# Patient Record
Sex: Female | Born: 1935 | Race: White | Hispanic: No | Smoking: Former smoker
Health system: Southern US, Community
[De-identification: ages and names within clinical notes are randomized; demographics above are authoritative.]

## PROBLEM LIST (undated history)

## (undated) DIAGNOSIS — G459 Transient cerebral ischemic attack, unspecified: Secondary | ICD-10-CM

## (undated) DIAGNOSIS — F0390 Unspecified dementia without behavioral disturbance: Secondary | ICD-10-CM

## (undated) HISTORY — PX: OTHER SURGICAL HISTORY: SHX169

---

## 2012-10-23 DIAGNOSIS — F0390 Unspecified dementia without behavioral disturbance: Secondary | ICD-10-CM | POA: Insufficient documentation

## 2012-12-09 ENCOUNTER — Emergency Department: Payer: Self-pay | Admitting: Emergency Medicine

## 2012-12-09 LAB — COMPREHENSIVE METABOLIC PANEL
Albumin: 3.4 g/dL (ref 3.4–5.0)
Alkaline Phosphatase: 151 U/L — ABNORMAL HIGH (ref 50–136)
BUN: 19 mg/dL — ABNORMAL HIGH (ref 7–18)
Calcium, Total: 8.7 mg/dL (ref 8.5–10.1)
Chloride: 105 mmol/L (ref 98–107)
Co2: 23 mmol/L (ref 21–32)
Creatinine: 0.81 mg/dL (ref 0.60–1.30)
EGFR (Non-African Amer.): >60
SGOT(AST): 28 U/L (ref 15–37)
SGPT (ALT): 18 U/L (ref 12–78)
Sodium: 136 mmol/L (ref 136–145)
Total Protein: 7.7 g/dL (ref 6.4–8.2)

## 2012-12-09 LAB — CBC
HCT: 43.9 % (ref 35.0–47.0)
HGB: 14.9 g/dL (ref 12.0–16.0)
MCH: 30.2 pg (ref 26.0–34.0)
MCV: 89 fL (ref 80–100)
Platelet: 263 10*3/uL (ref 150–440)
WBC: 8.1 10*3/uL (ref 3.6–11.0)

## 2012-12-09 LAB — TROPONIN I
Troponin-I: <0.02 ng/mL
Troponin-I: <0.02 ng/mL

## 2012-12-09 LAB — CK TOTAL AND CKMB (NOT AT ARMC): CK, Total: 164 U/L (ref 21–215)

## 2012-12-09 LAB — PRO B NATRIURETIC PEPTIDE: B-Type Natriuretic Peptide: 507 pg/mL — ABNORMAL HIGH (ref 0–450)

## 2016-02-02 ENCOUNTER — Emergency Department: Payer: Medicare HMO

## 2016-02-02 ENCOUNTER — Encounter: Payer: Self-pay | Admitting: *Deleted

## 2016-02-02 ENCOUNTER — Inpatient Hospital Stay
Admission: EM | Admit: 2016-02-02 | Discharge: 2016-02-04 | DRG: 689 | Payer: Medicare HMO | Attending: Internal Medicine | Admitting: Internal Medicine

## 2016-02-02 DIAGNOSIS — B9620 Unspecified Escherichia coli [E. coli] as the cause of diseases classified elsewhere: Secondary | ICD-10-CM | POA: Diagnosis present

## 2016-02-02 DIAGNOSIS — G9341 Metabolic encephalopathy: Secondary | ICD-10-CM | POA: Diagnosis present

## 2016-02-02 DIAGNOSIS — M6282 Rhabdomyolysis: Secondary | ICD-10-CM | POA: Diagnosis present

## 2016-02-02 DIAGNOSIS — Z7982 Long term (current) use of aspirin: Secondary | ICD-10-CM | POA: Diagnosis not present

## 2016-02-02 DIAGNOSIS — Z8249 Family history of ischemic heart disease and other diseases of the circulatory system: Secondary | ICD-10-CM

## 2016-02-02 DIAGNOSIS — N390 Urinary tract infection, site not specified: Principal | ICD-10-CM | POA: Diagnosis present

## 2016-02-02 DIAGNOSIS — I248 Other forms of acute ischemic heart disease: Secondary | ICD-10-CM | POA: Diagnosis present

## 2016-02-02 DIAGNOSIS — I4891 Unspecified atrial fibrillation: Secondary | ICD-10-CM | POA: Diagnosis present

## 2016-02-02 DIAGNOSIS — N179 Acute kidney failure, unspecified: Secondary | ICD-10-CM | POA: Diagnosis present

## 2016-02-02 DIAGNOSIS — E871 Hypo-osmolality and hyponatremia: Secondary | ICD-10-CM | POA: Diagnosis present

## 2016-02-02 DIAGNOSIS — F0390 Unspecified dementia without behavioral disturbance: Secondary | ICD-10-CM | POA: Diagnosis present

## 2016-02-02 DIAGNOSIS — R778 Other specified abnormalities of plasma proteins: Secondary | ICD-10-CM

## 2016-02-02 DIAGNOSIS — R7989 Other specified abnormal findings of blood chemistry: Secondary | ICD-10-CM

## 2016-02-02 DIAGNOSIS — R296 Repeated falls: Secondary | ICD-10-CM | POA: Diagnosis present

## 2016-02-02 DIAGNOSIS — Z87440 Personal history of urinary (tract) infections: Secondary | ICD-10-CM

## 2016-02-02 DIAGNOSIS — A419 Sepsis, unspecified organism: Secondary | ICD-10-CM | POA: Diagnosis present

## 2016-02-02 DIAGNOSIS — Z87891 Personal history of nicotine dependence: Secondary | ICD-10-CM

## 2016-02-02 DIAGNOSIS — Z8673 Personal history of transient ischemic attack (TIA), and cerebral infarction without residual deficits: Secondary | ICD-10-CM | POA: Diagnosis not present

## 2016-02-02 HISTORY — DX: Unspecified dementia, unspecified severity, without behavioral disturbance, psychotic disturbance, mood disturbance, and anxiety: F03.90

## 2016-02-02 HISTORY — DX: Transient cerebral ischemic attack, unspecified: G45.9

## 2016-02-02 LAB — COMPREHENSIVE METABOLIC PANEL
ALBUMIN: 3.3 g/dL — AB (ref 3.5–5.0)
ALT: 18 U/L (ref 14–54)
ANION GAP: 10 (ref 5–15)
AST: 26 U/L (ref 15–41)
Alkaline Phosphatase: 94 U/L (ref 38–126)
BUN: 20 mg/dL (ref 6–20)
CHLORIDE: 100 mmol/L — AB (ref 101–111)
CO2: 21 mmol/L — AB (ref 22–32)
CREATININE: 1.14 mg/dL — AB (ref 0.44–1.00)
Calcium: 8.9 mg/dL (ref 8.9–10.3)
GFR calc non Af Amer: 40 mL/min — ABNORMAL LOW (ref >60)
GFR, EST AFRICAN AMERICAN: 46 mL/min — AB (ref >60)
Glucose, Bld: 140 mg/dL — ABNORMAL HIGH (ref 65–99)
Potassium: 3.7 mmol/L (ref 3.5–5.1)
Sodium: 131 mmol/L — ABNORMAL LOW (ref 135–145)
Total Bilirubin: 1.2 mg/dL (ref 0.3–1.2)
Total Protein: 6.6 g/dL (ref 6.5–8.1)

## 2016-02-02 LAB — URINALYSIS COMPLETE WITH MICROSCOPIC (ARMC ONLY)
Bilirubin Urine: NEGATIVE
Glucose, UA: NEGATIVE mg/dL
Ketones, ur: NEGATIVE mg/dL
Nitrite: NEGATIVE
PH: 6 (ref 5.0–8.0)
PROTEIN: 100 mg/dL — AB
Specific Gravity, Urine: 1.011 (ref 1.005–1.030)

## 2016-02-02 LAB — LIPASE, BLOOD: LIPASE: 22 U/L (ref 11–51)

## 2016-02-02 LAB — CBC
HCT: 36.4 % (ref 35.0–47.0)
HEMOGLOBIN: 12.5 g/dL (ref 12.0–16.0)
MCH: 29.8 pg (ref 26.0–34.0)
MCHC: 34.3 g/dL (ref 32.0–36.0)
MCV: 86.8 fL (ref 80.0–100.0)
Platelets: 215 10*3/uL (ref 150–440)
RBC: 4.2 MIL/uL (ref 3.80–5.20)
RDW: 14.8 % — ABNORMAL HIGH (ref 11.5–14.5)
WBC: 13.2 10*3/uL — ABNORMAL HIGH (ref 3.6–11.0)

## 2016-02-02 LAB — CK: Total CK: 615 U/L — ABNORMAL HIGH (ref 38–234)

## 2016-02-02 LAB — TROPONIN I
TROPONIN I: 0.14 ng/mL — AB (ref <0.031)
TROPONIN I: 0.1 ng/mL — AB (ref <0.031)

## 2016-02-02 MED ORDER — ONDANSETRON HCL 4 MG PO TABS
4.0000 mg | ORAL_TABLET | Freq: Four times a day (QID) | ORAL | Status: DC | PRN
  Administered 2016-02-03 – 2016-02-04 (×2): 4 mg via ORAL
  Filled 2016-02-02 (×2): qty 1

## 2016-02-02 MED ORDER — DEXTROSE 5 % IV SOLN
1.0000 g | INTRAVENOUS | Status: AC
  Administered 2016-02-02: 1 g via INTRAVENOUS
  Filled 2016-02-02: qty 10

## 2016-02-02 MED ORDER — ACETAMINOPHEN 325 MG PO TABS
650.0000 mg | ORAL_TABLET | Freq: Four times a day (QID) | ORAL | Status: DC | PRN
Start: 2016-02-02 — End: 2016-02-04

## 2016-02-02 MED ORDER — ONDANSETRON HCL 4 MG/2ML IJ SOLN: 4.0000 mg | Freq: Four times a day (QID) | INTRAMUSCULAR | Status: DC | PRN

## 2016-02-02 MED ORDER — DOCUSATE SODIUM 100 MG PO CAPS
100.0000 mg | ORAL_CAPSULE | Freq: Two times a day (BID) | ORAL | Status: DC
  Administered 2016-02-02 – 2016-02-04 (×3): 100 mg via ORAL
  Filled 2016-02-02 (×4): qty 1

## 2016-02-02 MED ORDER — ASPIRIN 81 MG PO CHEW
324.0000 mg | CHEWABLE_TABLET | Freq: Once | ORAL | Status: AC
  Administered 2016-02-02: 324 mg via ORAL
  Filled 2016-02-02: qty 4

## 2016-02-02 MED ORDER — POLYETHYLENE GLYCOL 3350 17 G PO PACK: 17.0000 g | PACK | Freq: Every day | ORAL | Status: DC | PRN

## 2016-02-02 MED ORDER — LEVOFLOXACIN IN D5W 250 MG/50ML IV SOLN
250.0000 mg | Freq: Every day | INTRAVENOUS | Status: DC
  Administered 2016-02-02 – 2016-02-03 (×2): 250 mg via INTRAVENOUS
  Filled 2016-02-02 (×3): qty 50

## 2016-02-02 MED ORDER — ONDANSETRON HCL 4 MG/2ML IJ SOLN
4.0000 mg | INTRAMUSCULAR | Status: AC
  Administered 2016-02-02: 4 mg via INTRAVENOUS
  Filled 2016-02-02: qty 2

## 2016-02-02 MED ORDER — SODIUM CHLORIDE 0.9% FLUSH
3.0000 mL | Freq: Two times a day (BID) | INTRAVENOUS | Status: DC
  Administered 2016-02-02 – 2016-02-04 (×4): 3 mL via INTRAVENOUS

## 2016-02-02 MED ORDER — ENOXAPARIN SODIUM 30 MG/0.3ML ~~LOC~~ SOLN
30.0000 mg | SUBCUTANEOUS | Status: DC
  Filled 2016-02-02 (×2): qty 0.3

## 2016-02-02 MED ORDER — ASPIRIN 81 MG PO CHEW
81.0000 mg | CHEWABLE_TABLET | Freq: Every day | ORAL | Status: DC
  Administered 2016-02-03 – 2016-02-04 (×2): 81 mg via ORAL
  Filled 2016-02-02 (×2): qty 1

## 2016-02-02 MED ORDER — ACETAMINOPHEN 650 MG RE SUPP
650.0000 mg | Freq: Four times a day (QID) | RECTAL | Status: DC | PRN
Start: 2016-02-02 — End: 2016-02-04

## 2016-02-02 NOTE — ED Notes (Signed)
Pt able to ambulate to restroom and provide urine sample. Sample sent. RN will hold in and out cath.

## 2016-02-02 NOTE — ED Notes (Signed)
Patient transported to X-ray 

## 2016-02-02 NOTE — ED Notes (Signed)
Patient transported to CT 

## 2016-02-02 NOTE — H&P (Signed)
[facility] - [place] at [facility]   PATIENT NAME: Rhonda Castaneda    MR#:  [id number]  DATE OF BIRTH:  September 30, 1918  DATE OF ADMISSION:  02/02/2016  PRIMARY CARE PHYSICIAN: No primary care provider on file.   REQUESTING/REFERRING PHYSICIAN: Dr. Loleta Rose  CHIEF COMPLAINT:   Chief Complaint  Patient presents with  . Fall  . Altered Mental Status    HISTORY OF PRESENT ILLNESS:  Rhonda Castaneda  is a 82 y.o. female with a known history of mild dementia, history of TIAs not taking any medications currently brought in secondary to worsening confusion. Patient is confused and most of the history is obtained from her son at bedside. At baseline she is more alert and oriented. Noted to be more confused since yesterday. Also had an episode of nausea, extreme weakness and fall today and so brought to the emergency room. Labs indicate elevated troponin and also urinary tract infection. 2 weeks ago at an urgent care she was diagnosed with UTI and was started on Keflex. However they were called 24 hours later saying that the bacteria is resistant to Keflex and the change the antibiotic to amoxicillin. The culture results are not available at this time. Patient denies any chest pain. No EKG changes noted.  PAST MEDICAL HISTORY:   Past Medical History  Diagnosis Date  . Dementia   . TIA (transient ischemic attack)     PAST SURGICAL HISTORY:   Past Surgical History  Procedure Laterality Date  . Surgery on rib      when young    SOCIAL HISTORY:   Social History  Substance Use Topics  . Smoking status: Former Games developer  . Smokeless tobacco: Not on file     Comment: quit about 50 years ago  . Alcohol Use: No    FAMILY HISTORY:   Family History  Problem Relation Age of Onset  . CAD      DRUG ALLERGIES:  Not on File  REVIEW OF SYSTEMS:   Review of Systems  Unable to perform ROS: mental acuity    MEDICATIONS AT HOME:   Prior to Admission medications    Not on File      VITAL SIGNS:  Blood pressure 115/54, pulse 80, temperature 97.6 F (36.4 C), temperature source Oral, resp. rate 20, height 5\' 4"  (1.626 m), weight 67.189 kg (148 lb 2 oz), SpO2 93 %.  PHYSICAL EXAMINATION:   Physical Exam  GENERAL:  82 y.o.-year-old patient lying in the bed with no acute distress. Confused and repeating the same questions. EYES: Pupils equal, round, reactive to light and accommodation. No scleral icterus. Extraocular muscles intact.  HEENT: Head atraumatic, normocephalic. Oropharynx and nasopharynx clear.  NECK:  Supple, no jugular venous distention. No thyroid enlargement, no tenderness.  LUNGS: Normal breath sounds bilaterally, no wheezing, rales,rhonchi or crepitation. No use of accessory muscles of respiration.  CARDIOVASCULAR: S1, S2 normal. No rubs, or gallops. 2/6 systolic murmur present. ABDOMEN: Soft, nontender, nondistended. Bowel sounds present. No organomegaly or mass.  EXTREMITIES: No pedal edema, cyanosis, or clubbing.  NEUROLOGIC: Cranial nerves II through XII are intact. Muscle strength 5/5 in all extremities. Sensation intact. Gait not checked.  PSYCHIATRIC: The patient is alert and oriented x 1-2.  SKIN: No obvious rash, lesion, or ulcer.   LABORATORY PANEL:   CBC  Recent Labs Lab 02/02/16 1725  WBC 13.2*  HGB 12.5  HCT 36.4  PLT 215   ------------------------------------------------------------------------------------------------------------------  Chemistries   Recent Labs Lab  02/02/16 1725  NA 131*  K 3.7  CL 100*  CO2 21*  GLUCOSE 140*  BUN 20  CREATININE 1.14*  CALCIUM 8.9  AST 26  ALT 18  ALKPHOS 94  BILITOT 1.2   ------------------------------------------------------------------------------------------------------------------  Cardiac Enzymes  Recent Labs Lab 02/02/16 1725  TROPONINI 0.14*    ------------------------------------------------------------------------------------------------------------------  RADIOLOGY:  Dg Chest 2 View  02/02/2016  CLINICAL DATA:  Productive cough.  Recent fall. EXAM: CHEST  2 VIEW COMPARISON:  12/09/2012 FINDINGS: Heart size remains within normal limits. Increased central peribronchial thickening is noted as well mild bibasilar atelectasis versus infiltrates. No evidence of pleural effusion. IMPRESSION: Increased central peribronchial thickening, and mild bibasilar atelectasis versus infiltrates. Electronically Signed   By: Myles Rosenthal M.D.   On: 02/02/2016 18:58   Ct Head Wo Contrast  02/02/2016  CLINICAL DATA:  Altered mental status since a fall last night. EXAM: CT HEAD WITHOUT CONTRAST CT CERVICAL SPINE WITHOUT CONTRAST TECHNIQUE: Multidetector CT imaging of the head and cervical spine was performed following the standard protocol without intravenous contrast. Multiplanar CT image reconstructions of the cervical spine were also generated. COMPARISON:  None. FINDINGS: CT HEAD FINDINGS Diffusely enlarged ventricles and subarachnoid spaces. Patchy white matter low density in both cerebral hemispheres. 1.2 x 0.8 cm oval area of increased density and the left parietal region in an extra-axial location on images 19 and 20. This measures 58 Hounsfield units in density. No skull fractures or paranasal sinus air-fluid levels. Mild sphenoid sinus mucosal thickening. CT CERVICAL SPINE FINDINGS Multilevel degenerative changes. These include facet degenerative changes with associated mild anterolisthesis at the C4-5, C5-6 and C6-7 levels. No prevertebral soft tissue swelling or fractures. IMPRESSION: 1. No skull fracture or intracranial hemorrhage. 2. No cervical spine fracture or traumatic subluxation. 3. Moderate to marked diffuse cerebral and cerebellar atrophy and chronic small vessel white matter ischemic changes. 4. 1.2 cm left parietal meningioma. 5. Cervical spine  degenerative changes. 6. Minimal chronic sphenoid sinusitis. Electronically Signed   By: Beckie Salts M.D.   On: 02/02/2016 18:39   Ct Cervical Spine Wo Contrast  02/02/2016  CLINICAL DATA:  Altered mental status since a fall last night. EXAM: CT HEAD WITHOUT CONTRAST CT CERVICAL SPINE WITHOUT CONTRAST TECHNIQUE: Multidetector CT imaging of the head and cervical spine was performed following the standard protocol without intravenous contrast. Multiplanar CT image reconstructions of the cervical spine were also generated. COMPARISON:  None. FINDINGS: CT HEAD FINDINGS Diffusely enlarged ventricles and subarachnoid spaces. Patchy white matter low density in both cerebral hemispheres. 1.2 x 0.8 cm oval area of increased density and the left parietal region in an extra-axial location on images 19 and 20. This measures 58 Hounsfield units in density. No skull fractures or paranasal sinus air-fluid levels. Mild sphenoid sinus mucosal thickening. CT CERVICAL SPINE FINDINGS Multilevel degenerative changes. These include facet degenerative changes with associated mild anterolisthesis at the C4-5, C5-6 and C6-7 levels. No prevertebral soft tissue swelling or fractures. IMPRESSION: 1. No skull fracture or intracranial hemorrhage. 2. No cervical spine fracture or traumatic subluxation. 3. Moderate to marked diffuse cerebral and cerebellar atrophy and chronic small vessel white matter ischemic changes. 4. 1.2 cm left parietal meningioma. 5. Cervical spine degenerative changes. 6. Minimal chronic sphenoid sinusitis. Electronically Signed   By: Beckie Salts M.D.   On: 02/02/2016 18:39    EKG:   Orders placed or performed during the hospital encounter of 02/02/16  . EKG 12-Lead  . EKG 12-Lead    IMPRESSION AND  PLAN:   Rhonda Castaneda  is a 82 y.o. female with a known history of mild dementia, history of TIAs not taking any medications currently brought in secondary to worsening confusion.  #1 altered mental  status-metabolic encephalopathy secondary to UTI. -CT of the head showing diffuse cerebral and cerebellar atrophy with chronic small vessel ischemic changes. No acute findings noted. Patient has dementia at baseline.  #2 UTI-urine cultures are sent. Since she was recently told that her urine cultures grew bacteria not sensitive to Keflex, start on Levaquin at this time. Follow repeat urine cultures  #3 acute renal failure-prerenal causes. IV fluids and monitor  #4 elevated troponin-could be demand ischemia. Patient denying any chest pain. Start aspirin. -Hold off on systemic anticoagulation. Monitor on telemetry. -Cardiology consult. Recycle troponins  #5 DVT prophylaxis-on Lovenox  Physical therapy consult and social worker consult.    All the records are reviewed and case discussed with ED provider. Management plans discussed with the patient, family and they are in agreement.  CODE STATUS: Full Code  TOTAL TIME TAKING CARE OF THIS PATIENT: 50 minutes.    Enid BaasKALISETTI,Marquise Wicke M.D on 02/02/2016 at 8:58 PM  Between 7am to 6pm - Pager - [phone]  After 6pm go to [email or web address] - password [email or web address] [facility]  [facility]  Office  [phone]  CC: Primary care physician; No primary care provider on file.

## 2016-02-02 NOTE — Progress Notes (Signed)
Anticoagulation monitoring(Lovenox):  82 yo  ordered Lovenox 40 mg Q24h  Filed Weights   02/02/16 1719  Weight: 148 lb 2 oz (67.189 kg)   BMI  Lab Results  Component Value Date   CREATININE 1.14* 02/02/2016   CREATININE 0.81 12/09/2012   Estimated Creatinine Clearance: 27.8 mL/min (by C-G formula based on Cr of 1.14). Hemoglobin & Hematocrit     Component Value Date/Time   HGB 12.5 02/02/2016 1725   HGB 14.9 12/09/2012 0900   HCT 36.4 02/02/2016 1725   HCT 43.9 12/09/2012 0900     Per Protocol for Patient with estCrcl< 30 ml/min and BMI < 40, will transition to Lovenox 30 mg Q12h.

## 2016-02-02 NOTE — ED Notes (Signed)
Pt arrived to ED via EMS from [facility]. Unknown hx, allergies, medications. [facility] reports pt was at independent living for the past 4 years and [facility] does not have records yet. EMS reports pt is at ED due to a fall last night. PT has been reported to be confused since that time. Pt has been reportedly asking "wierd" questions. Pt does not have a PCP and son is reportedly not a good resource. Pt is alert upon arrival but continues to ask "what is wrong with me that you are doing all of this" pt is reminded by nursing staff that she has been reportedly falling more than normal and pt verbalized "Molli KnockOkay, is this a hospital?"

## 2016-02-02 NOTE — ED Notes (Signed)
Lab verbalized they could add CK lab onto green top already in lab.

## 2016-02-02 NOTE — Progress Notes (Signed)
Pt admitted to room 259. Oriented to self only, VSS, no complaints at this time. Pt educated on use of call bell and need to call nursing before up out of bed. Safety contract signed, bed alarm in use. Skin assessed and telemetry verified with Lexi, RN. RN will continue to monitor and treat per MD orders. Syliva Overmanassie A Allayna Erlich, RN

## 2016-02-02 NOTE — ED Provider Notes (Signed)
[facility] Emergency Department Provider Note  ____________________________________________  Time seen: Approximately 5:47 PM  I have reviewed the triage vital signs and the nursing notes.   HISTORY  Chief Complaint Fall and Altered Mental Status  The patient has severe and advanced chronic dementia which limits the history significantly  HPI Rhonda Castaneda is a 82 y.o. female who arrived initially with no known past medical history from a nursing facility where she has not lived for long for evaluation of multiple recent falls.  No other symptoms are provided and the patient is confused as to where she is and why she is here.  She has no complaints at this time except for being thirsty.  She states that she has no chest pain, shortness of breath, abdominal pain, vomiting, dysuria.  She did develop some nausea shortly after arrival which she states is "bad".  No other additional information about her history is available except that she has been falling occasionally recently.   History reviewed. No pertinent past medical history.  There are no active problems to display for this patient.   History reviewed. No pertinent past surgical history.  No current outpatient prescriptions on file.  Allergies Review of patient's allergies indicates not on file.  History reviewed. No pertinent family history.  Social History Social History  Substance Use Topics  . Smoking status: Former Games developermoker  . Smokeless tobacco: None  . Alcohol Use: No    Review of Systems Unable to obtain reliable review of systems from the patient given her dementia, but the only thing she complains of at this time is nausea ____________________________________________   PHYSICAL EXAM:  VITAL SIGNS: ED Triage Vitals  Enc Vitals Group     BP 02/02/16 1719 141/61 mmHg     Pulse Rate 02/02/16 1719 75     Resp 02/02/16 1719 21     Temp 02/02/16 1719 97.6 F (36.4 C)     Temp  Source 02/02/16 1719 Oral     SpO2 02/02/16 1719 98 %     Weight 02/02/16 1719 148 lb 2 oz (67.189 kg)     Height 02/02/16 1719 5\' 4"  (1.626 m)     Head Cir --      Peak Flow --      Pain Score --      Pain Loc --      Pain Edu? --      Excl. in GC? --     Constitutional: Alert, Oriented only to self.  No acute distress. Eyes: Conjunctivae are normal. PERRL. EOMI. Head: Atraumatic. Nose: No congestion/rhinnorhea. Mouth/Throat: Mucous membranes are moist.  Oropharynx non-erythematous. Neck: No stridor.  No meningeal signs.  No cervical spine tenderness to palpation. Cardiovascular: Normal rate, regular rhythm. Good peripheral circulation. Grossly normal heart sounds.   Respiratory: Normal respiratory effort.  No retractions. Lungs CTAB. Gastrointestinal: Soft and nontender. No distention.  Musculoskeletal: No lower extremity tenderness nor edema. No gross deformities of extremities. Neurologic:  Normal speech and language. No gross focal neurologic deficits are appreciated.  Skin:  Skin is warm, dry and intact. No rash noted.   ____________________________________________   LABS (all labs ordered are listed, but only abnormal results are displayed)  Labs Reviewed  COMPREHENSIVE METABOLIC PANEL - Abnormal; Notable for the following:    Sodium 131 (*)    Chloride 100 (*)    CO2 21 (*)    Glucose, Bld 140 (*)    Creatinine, Ser 1.14 (*)  Albumin 3.3 (*)    GFR calc non Af Amer 40 (*)    GFR calc Af Amer 46 (*)    All other components within normal limits  CBC - Abnormal; Notable for the following:    WBC 13.2 (*)    RDW 14.8 (*)    All other components within normal limits  URINALYSIS COMPLETEWITH MICROSCOPIC ([facility] ONLY) - Abnormal; Notable for the following:    Color, Urine YELLOW (*)    APPearance TURBID (*)    Hgb urine dipstick 2+ (*)    Protein, ur 100 (*)    Leukocytes, UA 3+ (*)    Bacteria, UA FEW (*)    Squamous Epithelial / LPF 0-5 (*)    All other  components within normal limits  TROPONIN I - Abnormal; Notable for the following:    Troponin I 0.14 (*)    All other components within normal limits  URINE CULTURE  LIPASE, BLOOD   ____________________________________________  EKG  ED ECG REPORT I, Burlene Montecalvo, the attending physician, personally viewed and interpreted this ECG.  Date: 02/02/2016 EKG Time: 17:19 Rate: 95 Rhythm: normal sinus rhythm QRS Axis: normal Intervals: normal other than sinus arrhythmia ST/T Wave abnormalities: normal Conduction Disturbances: none Narrative Interpretation: unremarkable  ____________________________________________  RADIOLOGY   Dg Chest 2 View  02/02/2016  CLINICAL DATA:  Productive cough.  Recent fall. EXAM: CHEST  2 VIEW COMPARISON:  12/09/2012 FINDINGS: Heart size remains within normal limits. Increased central peribronchial thickening is noted as well mild bibasilar atelectasis versus infiltrates. No evidence of pleural effusion. IMPRESSION: Increased central peribronchial thickening, and mild bibasilar atelectasis versus infiltrates. Electronically Signed   By: Myles Rosenthal M.D.   On: 02/02/2016 18:58   Ct Head Wo Contrast  02/02/2016  CLINICAL DATA:  Altered mental status since a fall last night. EXAM: CT HEAD WITHOUT CONTRAST CT CERVICAL SPINE WITHOUT CONTRAST TECHNIQUE: Multidetector CT imaging of the head and cervical spine was performed following the standard protocol without intravenous contrast. Multiplanar CT image reconstructions of the cervical spine were also generated. COMPARISON:  None. FINDINGS: CT HEAD FINDINGS Diffusely enlarged ventricles and subarachnoid spaces. Patchy white matter low density in both cerebral hemispheres. 1.2 x 0.8 cm oval area of increased density and the left parietal region in an extra-axial location on images 19 and 20. This measures 58 Hounsfield units in density. No skull fractures or paranasal sinus air-fluid levels. Mild sphenoid sinus mucosal  thickening. CT CERVICAL SPINE FINDINGS Multilevel degenerative changes. These include facet degenerative changes with associated mild anterolisthesis at the C4-5, C5-6 and C6-7 levels. No prevertebral soft tissue swelling or fractures. IMPRESSION: 1. No skull fracture or intracranial hemorrhage. 2. No cervical spine fracture or traumatic subluxation. 3. Moderate to marked diffuse cerebral and cerebellar atrophy and chronic small vessel white matter ischemic changes. 4. 1.2 cm left parietal meningioma. 5. Cervical spine degenerative changes. 6. Minimal chronic sphenoid sinusitis. Electronically Signed   By: Beckie Salts M.D.   On: 02/02/2016 18:39   Ct Cervical Spine Wo Contrast  02/02/2016  CLINICAL DATA:  Altered mental status since a fall last night. EXAM: CT HEAD WITHOUT CONTRAST CT CERVICAL SPINE WITHOUT CONTRAST TECHNIQUE: Multidetector CT imaging of the head and cervical spine was performed following the standard protocol without intravenous contrast. Multiplanar CT image reconstructions of the cervical spine were also generated. COMPARISON:  None. FINDINGS: CT HEAD FINDINGS Diffusely enlarged ventricles and subarachnoid spaces. Patchy white matter low density in both cerebral hemispheres. 1.2 x 0.8  cm oval area of increased density and the left parietal region in an extra-axial location on images 19 and 20. This measures 58 Hounsfield units in density. No skull fractures or paranasal sinus air-fluid levels. Mild sphenoid sinus mucosal thickening. CT CERVICAL SPINE FINDINGS Multilevel degenerative changes. These include facet degenerative changes with associated mild anterolisthesis at the C4-5, C5-6 and C6-7 levels. No prevertebral soft tissue swelling or fractures. IMPRESSION: 1. No skull fracture or intracranial hemorrhage. 2. No cervical spine fracture or traumatic subluxation. 3. Moderate to marked diffuse cerebral and cerebellar atrophy and chronic small vessel white matter ischemic changes. 4. 1.2 cm  left parietal meningioma. 5. Cervical spine degenerative changes. 6. Minimal chronic sphenoid sinusitis. Electronically Signed   By: Beckie Salts M.D.   On: 02/02/2016 18:39    ____________________________________________   PROCEDURES  Procedure(s) performed: None  Critical Care performed: No ____________________________________________   INITIAL IMPRESSION / ASSESSMENT AND PLAN / ED COURSE  Pertinent labs & imaging results that were available during my care of the patient were reviewed by me and considered in my medical decision making (see chart for details).  Initially we knew nothing about the patient's medical history, but I reached her son who is also her power of attorney by phone.  He confirmed that she has advanced dementia but no other significant medical issues.  He and his wife arrived and I spoke with all them in person.  The patient's workup is reassuring except that she has a significantly elevated troponin.  Given her age and comorbidity of dementia, I explained that it is difficult to say for sure what the cardiologist will want to do.  The family is open to all options including potentially invasive procedures if recommended.  I will give the patient a full dose aspirin but will hold on heparin at this time.  I discussed with the hospitalist who agrees with this plan.  8:02 PM The patient has a strongly positive urinary tract infection.  I will treat with ceftriaxone 1 g IV.  A urine culture has been sent.  ____________________________________________  FINAL CLINICAL IMPRESSION(S) / ED DIAGNOSES  Final diagnoses:  Elevated troponin I level  Chronic dementia, without behavioral disturbance     MEDICATIONS GIVEN DURING THIS VISIT:  Medications  aspirin chewable tablet 324 mg (not administered)  ondansetron (ZOFRAN) injection 4 mg (4 mg Intravenous Given 02/02/16 1834)  ceftriaxone 1 g IV   NEW OUTPATIENT MEDICATIONS STARTED DURING THIS VISIT:  New  Prescriptions   No medications on file      Note:  This document was prepared using Dragon voice recognition software and may include unintentional dictation errors.   Loleta Rose, MD 02/02/16 2002

## 2016-02-02 NOTE — Progress Notes (Signed)
ANTIBIOTIC CONSULT NOTE - INITIAL  Pharmacy Consult for Levaquin  Indication: UTI  Not on File  Patient Measurements: Height: 5\' 4"  (162.6 cm) Weight: 148 lb 2 oz (67.189 kg) IBW/kg (Calculated) : 54.7 Adjusted Body Weight:   Vital Signs: Temp: 97.6 F (36.4 C) (05/09 1719) Temp Source: Oral (05/09 1719) BP: 115/54 mmHg (05/09 2000) Pulse Rate: 80 (05/09 2000) Intake/Output from previous day:   Intake/Output from this shift:    Labs:  Recent Labs  02/02/16 1725  WBC 13.2*  HGB 12.5  PLT 215  CREATININE 1.14*   Estimated Creatinine Clearance: 27.8 mL/min (by C-G formula based on Cr of 1.14). No results for input(s): VANCOTROUGH, VANCOPEAK, VANCORANDOM, GENTTROUGH, GENTPEAK, GENTRANDOM, TOBRATROUGH, TOBRAPEAK, TOBRARND, AMIKACINPEAK, AMIKACINTROU, AMIKACIN in the last 72 hours.   Microbiology: No results found for this or any previous visit (from the past 720 hour(s)).  Medical History: Past Medical History  Diagnosis Date  . Dementia   . TIA (transient ischemic attack)     Medications:   (Not in a hospital admission) Assessment: CrCl = 27.8 ml/min  Goal of Therapy:  resolution of infection  Plan:  Expected duration 7 days with resolution of temperature and/or normalization of WBC   Will start levaquin 250 mg IV Q24H on 5/9.   Swara Donze D 02/02/2016,9:04 PM

## 2016-02-03 ENCOUNTER — Inpatient Hospital Stay (HOSPITAL_COMMUNITY)
Admit: 2016-02-03 | Discharge: 2016-02-03 | Disposition: A | Discharge status: Home / Self Care | Payer: Medicare HMO | Attending: Physician Assistant | Admitting: Physician Assistant

## 2016-02-03 DIAGNOSIS — A419 Sepsis, unspecified organism: Secondary | ICD-10-CM

## 2016-02-03 DIAGNOSIS — R7989 Other specified abnormal findings of blood chemistry: Secondary | ICD-10-CM

## 2016-02-03 LAB — BASIC METABOLIC PANEL
Anion gap: 8 (ref 5–15)
BUN: 23 mg/dL — ABNORMAL HIGH (ref 6–20)
CALCIUM: 8.6 mg/dL — AB (ref 8.9–10.3)
CO2: 23 mmol/L (ref 22–32)
CREATININE: 1.25 mg/dL — AB (ref 0.44–1.00)
Chloride: 101 mmol/L (ref 101–111)
GFR, EST AFRICAN AMERICAN: 41 mL/min — AB (ref >60)
GFR, EST NON AFRICAN AMERICAN: 35 mL/min — AB (ref >60)
Glucose, Bld: 129 mg/dL — ABNORMAL HIGH (ref 65–99)
Potassium: 4.1 mmol/L (ref 3.5–5.1)
Sodium: 132 mmol/L — ABNORMAL LOW (ref 135–145)

## 2016-02-03 LAB — MRSA PCR SCREENING: MRSA by PCR: NEGATIVE

## 2016-02-03 LAB — CBC
HCT: 34 % — ABNORMAL LOW (ref 35.0–47.0)
Hemoglobin: 11.8 g/dL — ABNORMAL LOW (ref 12.0–16.0)
MCH: 29.9 pg (ref 26.0–34.0)
MCHC: 34.6 g/dL (ref 32.0–36.0)
MCV: 86.5 fL (ref 80.0–100.0)
PLATELETS: 202 10*3/uL (ref 150–440)
RBC: 3.93 MIL/uL (ref 3.80–5.20)
RDW: 14.4 % (ref 11.5–14.5)
WBC: 9.6 10*3/uL (ref 3.6–11.0)

## 2016-02-03 LAB — TROPONIN I
TROPONIN I: 0.09 ng/mL — AB (ref <0.031)
TROPONIN I: 0.06 ng/mL — AB (ref <0.031)

## 2016-02-03 LAB — ECHOCARDIOGRAM COMPLETE
Height: 64 in
WEIGHTICAEL: 2370 [oz_av]

## 2016-02-03 NOTE — Evaluation (Signed)
Physical Therapy Evaluation Patient Details Name: Rhonda RohrerLillian S Castaneda MRN: [id number] DOB: 07-31-1919 Today's Date: 02/03/2016   History of Present Illness  Pt is a 82 y.o. F admitted to hospital for a fall and increased confusion. Pt found to have UTI after admission. Pt has hx of dementia and TIAs. Pt has lived at [facility] for past 4 years per chart.   Clinical Impression  Pt is a 82 y.o. F admitted to hospital for UTI and confusion. Prior to admission, pt lived [facility]. Pt confused during evaluation, therefore was a poor historian. Pt oriented to self, but not to location. Pt able to transfer x2 from recliner using RW and mod assist. Pt able to ambulate approx 5 ft using RW and mod assist. Pt anxious t/o all mobility, asking to sit-down soon after standing. Pt performed seated there-ex on B LE with mod to no assist. Pt demonstrates deficits in strength, balance, and mobility. Pt would benefit from further skilled PT to address deficits. Pt also will need 24/7 assistance for safety with ADLs. Recommend pt transition to higher level 24/7 care with home health PT to address deficits.     Follow Up Recommendations Home health PT;Supervision/Assistance - 24 hour    Equipment Recommendations       Recommendations for Other Services       Precautions / Restrictions Precautions Precautions: Fall Restrictions Weight Bearing Restrictions: No      Mobility  Bed Mobility               General bed mobility comments: Pt up in chair at start of evaluation.  Transfers Overall transfer level: Needs assistance Equipment used: Rolling walker (2 wheeled) Transfers: Sit to/from Stand Sit to Stand: Mod assist         General transfer comment: Pt able to transfer from EOB using RW and mod assist. Pt provided heavy cues regarding hand placement during transfer. Pt able to upright posture upon standing. Pt performed transfer x2 from recliner.    Ambulation/Gait Ambulation/Gait assistance: Mod assist Ambulation Distance (Feet): 5 Feet Assistive device: Rolling walker (2 wheeled) Gait Pattern/deviations: Step-to pattern;Staggering left;Staggering right Gait velocity: slow Gait velocity interpretation: <1.8 ft/sec, indicative of risk for recurrent falls General Gait Details: Pt able to ambulate approx 5 ft with RW and mod assist. Pt provided continuous cues regarding walker and foot placement. Pt anxious and stated she felt like she was going to fall, requested to sit down. After sitting for approx 30 secs, pt able to stand again and take steps in place before requesting to sit again.    Stairs            Wheelchair Mobility    Modified Rankin (Stroke Patients Only)       Balance Overall balance assessment: Needs assistance Sitting-balance support: Feet supported Sitting balance-Leahy Scale: Good Sitting balance - Comments: Pt demonstrated good sitting balance while sitting on edge of recliner for approx 2 mins.    Standing balance support: Bilateral upper extremity supported Standing balance-Leahy Scale: Poor Standing balance comment: Pt demonstrated poor standing balance with RW and assist from PT. Pt stated she felt unsteady when standing.                              Pertinent Vitals/Pain Pain Assessment: No/denies pain    Home Living Family/patient expects to be discharged to:: Assisted living  Home Equipment: Gilmer Mor - single point Additional Comments: Pt is poor historian, unable to obtain what equipment pt has at home.     Prior Function Level of Independence: Needs assistance   Gait / Transfers Assistance Needed: Pt stated she thinks she walked with cane prior to admission. No family present to confirm.  ADL's / Homemaking Assistance Needed: Pt stated she is independent with ADLs.         Hand Dominance        Extremity/Trunk Assessment   Upper Extremity  Assessment: RUE deficits/detail;LUE deficits/detail RUE Deficits / Details: R UE grossly 3+/5 strength     LUE Deficits / Details: L UE grossly 3+/5 strength   Lower Extremity Assessment: RLE deficits/detail;LLE deficits/detail RLE Deficits / Details: R LE grossly 4/5 strength LLE Deficits / Details: L ankle 4/5 strength, unable to assess knee strength d/t pts pain in knee.      Communication   Communication: No difficulties  Cognition Arousal/Alertness: Awake/alert Behavior During Therapy: Anxious Overall Cognitive Status: No family/caregiver present to determine baseline cognitive functioning (Pt has hx of dementia)                      General Comments      Exercises Other Exercises Other Exercises: Pt performed seated ther-ex on B LE including SLR with mod assist, ankle pumps and marching in place with no assist. Pt provided cues regarding proper form of exercises. All ther-ex performed x10 reps.        Assessment/Plan    PT Assessment Patient needs continued PT services  PT Diagnosis Abnormality of gait;Difficulty walking;Generalized weakness   PT Problem List Decreased strength;Decreased balance;Decreased mobility;Decreased knowledge of use of DME  PT Treatment Interventions DME instruction;Gait training;Therapeutic activities;Therapeutic exercise;Balance training   PT Goals (Current goals can be found in the Care Plan section) Acute Rehab PT Goals Patient Stated Goal: to return home.  PT Goal Formulation: With patient Time For Goal Achievement: 02/17/16 Potential to Achieve Goals: Fair    Frequency Min 2X/week   Barriers to discharge Decreased caregiver support Pt will need 24/7 care after discharge from hospital.     Co-evaluation               End of Session Equipment Utilized During Treatment: Gait belt Activity Tolerance: Treatment limited secondary to agitation Patient left: in chair;with call bell/phone within reach;with chair alarm set            Time: 1610-9604 PT Time Calculation (min) (ACUTE ONLY): 23 min   Charges:         PT G Codes:        Dorita Fray 02/21/2016, 12:19 PM M. Hettie Holstein, SPT

## 2016-02-03 NOTE — Consult Note (Signed)
Cardiology Consultation Note  Patient ID: Rhonda Castaneda, MRN: [id number], DOB/AGE: 10/01/1918 82 y.o. Admit date: 02/02/2016   Date of Consult: 02/03/2016 Primary Physician: No primary care provider on file. Primary Cardiologist: New to [facility] Requesting Physician: Henreitta Leber, MD  Chief Complaint: Fall, AMS Reason for Consult: Elevated troponin in the setting rhabdomyolysis   HPI: 82 y.o. female with h/o dementia, multiple recent falls, TIA, and not on any medications at home who presented to [facility] on 5[facility]9 with increased confusion and after suffering a fall and being down on the ground for an unknown duration. She was found to have a recurrent UTI after recently being treated for one and have an elevated CK of 615 as below as well as mildly elevated troponin that is down trending. Cardiology is consulted for the above mildly elevated troponin.  History is taken from prior notes given patient's dementia and no family present at this time. She has no previously known cardiac history. She does not known why she is here at the hospital. She does not recall falling. We are unable to assess if she had any chest pain, SOB, diaphoresis, palpitations, or nausea leading up to this event. It is unclear at this time if this was a mechanical fall at this time based on prior notes. She denies any symptoms currently c/w chest pain, though does have dementia and metabolic encephalopathy. She does not know if she has ever smoked tobacco. She initially asks if she can "go out of town" upon me walking in the room. She then asks if she can get up and walk to get her breakfast this morning.    Upon the patient's arrival to [facility] they were found to have CK of 615, troponin 0.14-->0.10-->0.09, CKMB 2.8, UA c/w UTI, SCr 1.14-->1.25, BUN-->23, K+ 3.7-->4.1, Na 131-->132, WBC 13.2-->9.6. ECG as below, CXR showed increased central peribronchial thickening and mild bibasilar atelectasis vs infiltrate. CT head/C spine no skull fx  or intracranial hemorrhage, no C spine fx. She was started on Levaquin and received 1 dose of Rocephin. There is no family present in the room at this time for further history.    Past Medical History  Diagnosis Date  . Dementia   . TIA (transient ischemic attack)       Most Recent Cardiac Studies: none   Surgical History:  Past Surgical History  Procedure Laterality Date  . Surgery on rib      when young     Home Meds: Prior to Admission medications   Not on File    Inpatient Medications:  . aspirin  81 mg Oral Daily  . docusate sodium  100 mg Oral BID  . enoxaparin (LOVENOX) injection  30 mg Subcutaneous Q24H  . levofloxacin (LEVAQUIN) IV  250 mg Intravenous QHS  . sodium chloride flush  3 mL Intravenous Q12H      Allergies: Not on File  Social History   Social History  . Marital Status: Unknown    Spouse Name: N/A  . Number of Children: N/A  . Years of Education: N/A   Occupational History  . Not on file.   Social History Main Topics  . Smoking status: Former Games developer  . Smokeless tobacco: Not on file     Comment: quit about 50 years ago  . Alcohol Use: No  . Drug Use: No  . Sexual Activity: No   Other Topics Concern  . Not on file   Social History Narrative   Ambulates with walker.  From assisted living.     Family History  Problem Relation Age of Onset  . CAD       Review of Systems: Review of Systems  Unable to perform ROS: dementia  She is not able to answer questions. Simply asks about when she can get out of bed  Labs:  Recent Labs  02/02/16 1725 02/02/16 2221 02/03/16 0428  CKTOTAL 615*  --   --   TROPONINI 0.14* 0.10* 0.09*   Lab Results  Component Value Date   WBC 9.6 02/03/2016   HGB 11.8* 02/03/2016   HCT 34.0* 02/03/2016   MCV 86.5 02/03/2016   PLT 202 02/03/2016    Recent Labs Lab 02/02/16 1725 02/03/16 0428  NA 131* 132*  K 3.7 4.1  CL 100* 101  CO2 21* 23  BUN 20 23*  CREATININE 1.14* 1.25*  CALCIUM 8.9  8.6*  PROT 6.6  --   BILITOT 1.2  --   ALKPHOS 94  --   ALT 18  --   AST 26  --   GLUCOSE 140* 129*   No results found for: CHOL, HDL, LDLCALC, TRIG No results found for: DDIMER  Radiology/Studies:  Dg Chest 2 View  02/02/2016  CLINICAL DATA:  Productive cough.  Recent fall. EXAM: CHEST  2 VIEW COMPARISON:  12/09/2012 FINDINGS: Heart size remains within normal limits. Increased central peribronchial thickening is noted as well mild bibasilar atelectasis versus infiltrates. No evidence of pleural effusion. IMPRESSION: Increased central peribronchial thickening, and mild bibasilar atelectasis versus infiltrates. Electronically Signed   By: Myles Rosenthal M.D.   On: 02/02/2016 18:58   Ct Head Wo Contrast  02/02/2016  CLINICAL DATA:  Altered mental status since a fall last night. EXAM: CT HEAD WITHOUT CONTRAST CT CERVICAL SPINE WITHOUT CONTRAST TECHNIQUE: Multidetector CT imaging of the head and cervical spine was performed following the standard protocol without intravenous contrast. Multiplanar CT image reconstructions of the cervical spine were also generated. COMPARISON:  None. FINDINGS: CT HEAD FINDINGS Diffusely enlarged ventricles and subarachnoid spaces. Patchy white matter low density in both cerebral hemispheres. 1.2 x 0.8 cm oval area of increased density and the left parietal region in an extra-axial location on images 19 and 20. This measures 58 Hounsfield units in density. No skull fractures or paranasal sinus air-fluid levels. Mild sphenoid sinus mucosal thickening. CT CERVICAL SPINE FINDINGS Multilevel degenerative changes. These include facet degenerative changes with associated mild anterolisthesis at the C4-5, C5-6 and C6-7 levels. No prevertebral soft tissue swelling or fractures. IMPRESSION: 1. No skull fracture or intracranial hemorrhage. 2. No cervical spine fracture or traumatic subluxation. 3. Moderate to marked diffuse cerebral and cerebellar atrophy and chronic small vessel white  matter ischemic changes. 4. 1.2 cm left parietal meningioma. 5. Cervical spine degenerative changes. 6. Minimal chronic sphenoid sinusitis. Electronically Signed   By: Beckie Salts M.D.   On: 02/02/2016 18:39   Ct Cervical Spine Wo Contrast  02/02/2016  CLINICAL DATA:  Altered mental status since a fall last night. EXAM: CT HEAD WITHOUT CONTRAST CT CERVICAL SPINE WITHOUT CONTRAST TECHNIQUE: Multidetector CT imaging of the head and cervical spine was performed following the standard protocol without intravenous contrast. Multiplanar CT image reconstructions of the cervical spine were also generated. COMPARISON:  None. FINDINGS: CT HEAD FINDINGS Diffusely enlarged ventricles and subarachnoid spaces. Patchy white matter low density in both cerebral hemispheres. 1.2 x 0.8 cm oval area of increased density and the left parietal region in an extra-axial location on images 19  and 20. This measures 58 Hounsfield units in density. No skull fractures or paranasal sinus air-fluid levels. Mild sphenoid sinus mucosal thickening. CT CERVICAL SPINE FINDINGS Multilevel degenerative changes. These include facet degenerative changes with associated mild anterolisthesis at the C4-5, C5-6 and C6-7 levels. No prevertebral soft tissue swelling or fractures. IMPRESSION: 1. No skull fracture or intracranial hemorrhage. 2. No cervical spine fracture or traumatic subluxation. 3. Moderate to marked diffuse cerebral and cerebellar atrophy and chronic small vessel white matter ischemic changes. 4. 1.2 cm left parietal meningioma. 5. Cervical spine degenerative changes. 6. Minimal chronic sphenoid sinusitis. Electronically Signed   By: Beckie Salts M.D.   On: 02/02/2016 18:39    EKG: NSR with sinus arrhythmia, 95 bpm, baseline wandering limb leads, no acute st/t changes   Weights: Filed Weights   02/02/16 1719  Weight: 148 lb 2 oz (67.189 kg)     Physical Exam: Blood pressure 119/45, pulse 94, temperature 97.3 F (36.3 C),  temperature source Oral, resp. rate 16, height 5\' 4"  (1.626 m), weight 148 lb 2 oz (67.189 kg), SpO2 94 %. Body mass index is 25.41 kg/(m^2). General: Well developed, well nourished, in no acute distress. Head: Normocephalic, atraumatic, sclera non-icteric, no xanthomas, nares are without discharge.  Neck: Negative for carotid bruits. JVD not elevated. Lungs: Clear bilaterally to auscultation without wheezes, rales, or rhonchi. Breathing is unlabored. Heart: RRR with S1 S2. No murmurs, rubs, or gallops appreciated. Abdomen: Soft, non-tender, non-distended with normoactive bowel sounds. No hepatomegaly. No rebound/guarding. No obvious abdominal masses. Msk:  Strength and tone appear normal for age. Extremities: No clubbing or cyanosis. No edema.  Distal pedal pulses are 2+ and equal bilaterally. Neuro: Alert, not oriented to time or place. No facial asymmetry. No focal deficit. Moves all extremities spontaneously. Psych:  Responds to questions, though does not know any answers.    Assessment and Plan:   1. Mildly elevated troponin: -Down trending, likely in the setting of the patient's rhabdomyolysis given her elevated CK of 615 and negative CKMB -Check echo to assess LV systolic function and wall motion  -No indication for heparin gtt at this time -On aspirin 81 mg daily -Unlike to be an invasive candidate given her dementia, recurrent recent falls, and advanced age  53. Metabolic encephalopathy: -Likely in the setting of the patient's UTI and hyponatremia  -ABX per IM -Treat hyponatremia per IM  3. Multiple recent falls: -Needs PT evaluation   4. Dementia: -As above  5. Acute renal injury: -Likely pre-renal  -Gentle hydration    Signed, Eula Listen, PA-C Pager: [phone] 02/03/2016, 8:13 AM   I have seen, examined and evaluated the patient this AM along with Mr. Tama Headings in consultation.  After reviewing all the available data and chart,  I agree with his findings,  examination as well as impression recommendations.   82 year old woman with known dementia. Unable to get a real good history. No known prior history of coronary disease. She is admitted for confusion and a fall with elevated CK levels. Not unexpectedly she has mild troponin elevation in the setting of mild renal insufficiency and CK-MB elevation. I do not think that this is anyway related to cardiac etiology. She is hemodynamically stable and no complaint of any chest pain and and/or dyspnea.  An echocardiogram has been ordered which will follow-up on, her I'm not sure how much more we will do the data unless there is indication to potentially treat heart failure if it is noted. I don't think  that has anything to do with this current hospital stay.   Would not recommend further cardiac evaluation regardless of the echocardiogram.  We will sign off.    Marykay LexHARDING,Trenika Hudson W, M.D., M.S.  [place] [facility]  [address] [place], [place] [address] [phone] Fax [phone]

## 2016-02-03 NOTE — Progress Notes (Signed)
*  PRELIMINARY RESULTS* Echocardiogram 2D Echocardiogram has been performed.  Rhonda HousekeeperJerry R Castaneda 02/03/2016, 10:33 AM

## 2016-02-03 NOTE — Clinical Social Work Note (Signed)
Clinical Social Work Assessment  Patient Details  Name: Rhonda RohrerLillian S Halder MRN: [id number] Date of Birth: [birth year]  Date of referral:  02/03/16               Reason for consult:  Facility Placement                Permission sought to share information with:   (patient with advanced dementia) Permission granted to share information::     Name::        Agency::     Relationship::     Contact Information:     Housing/Transportation Living arrangements for the past 2 months:  [facility] Source of Information:  Facility, Adult Children Patient Interpreter Needed:  None Criminal Activity/Legal Involvement Pertinent to Current Situation/Hospitalization:  No - Comment as needed Significant Relationships:  Adult Children Lives with:  Facility Resident Do you feel safe going back to the place where you live?    Need for family participation in patient care:     Care giving concerns:  Patient has [facility].  Social Worker assessment / plan:  [facility] contacted [facility] as it was initially unknown that she was from their independent section. CSW spoke with Kendal [facility]Bonnie at [facility]. Kendal HymenBonnie stated that they had thought family was taking patient to her appointments and giving her baths but that they may not have been due to how sick she was. Kendal HymenBonnie stated that patient's belongings have been moved the their assisted living section. They will take her back. CSW contacted patient's son: Mr. Roselee Novaltman and he confirmed that patient is to go to the ALF and is in agreement. He may choose to transport her back when time.   Employment status:  Retired Database administratornsurance information:  Managed Medicare PT Recommendations:  Not assessed at this time Information / Referral to community resources:     Patient/Family's Response to care:  Patient's son expressed appreciation for CSW assistance.  Patient/Family's Understanding of and Emotional Response to Diagnosis,  Current Treatment, and Prognosis:  Kend[facility] at [facility] are aware patient requires a higher level of care. Patient's son is in agreement.  Emotional Assessment Appearance:  Appears stated age Attitude/Demeanor/Rapport:  Unable to Assess Affect (typically observed):  Unable to Assess Orientation:   (disoriented X4) Alcohol / Substance use:    Psych involvement (Current and /or in the community):  No (Comment)  Discharge Needs  Concerns to be addressed:  Care Coordination Readmission within the last 30 days:  No Current discharge risk:  None Barriers to Discharge:  No Barriers Identified   York SpanielMonica Rmoni Keplinger, LCSW 02/03/2016, 10:52 AM

## 2016-02-03 NOTE — Progress Notes (Signed)
Patient ID: Rhonda Castaneda, female   DOB: Jan 20, 1919, 82 y.o.   MRN: [id number] [facility] - [place] at [facility]   PATIENT NAME: Rhonda Castaneda    MR#:  [id number]  DATE OF BIRTH:  [birth year]  SUBJECTIVE:  Came in with increasing confusion Tells me her name is "[employer]" Baseline dementia Admitted with UTI  REVIEW OF SYSTEMS:   Review of Systems  Unable to perform ROS: dementia   Tolerating Diet:yes Tolerating PT: pending  DRUG ALLERGIES:  Not on File  VITALS:  Blood pressure 119/45, pulse 94, temperature 97.3 F (36.3 C), temperature source Oral, resp. rate 16, height  (1.626 m), weight 67.189 kg (148 lb 2 oz), SpO2 94 %.  PHYSICAL EXAMINATION:   Physical Exam  GENERAL:  82 y.o.-year-old patient lying in the bed with no acute distress.  EYES: Pupils equal, round, reactive to light and accommodation. No scleral icterus. Extraocular muscles intact.  HEENT: Head atraumatic, normocephalic. Oropharynx and nasopharynx clear.  NECK:  Supple, no jugular venous distention. No thyroid enlargement, no tenderness.  LUNGS: Normal breath sounds bilaterally, no wheezing, rales, rhonchi. No use of accessory muscles of respiration.  CARDIOVASCULAR: S1, S2 normal. No murmurs, rubs, or gallops.  ABDOMEN: Soft, nontender, nondistended. Bowel sounds present. No organomegaly or mass.  EXTREMITIES: No cyanosis, clubbing or edema b/l.    NEUROLOGIC:  No focal Motor or sensory deficits b/l.  Baseline dementia PSYCHIATRIC:  patient is alert . dementia SKIN: No obvious rash, lesion, or ulcer.   LABORATORY PANEL:  CBC  Recent Labs Lab 02/03/16 0428  WBC 9.6  HGB 11.8*  HCT 34.0*  PLT 202    Chemistries   Recent Labs Lab 02/02/16 1725 02/03/16 0428  NA 131* 132*  K 3.7 4.1  CL 100* 101  CO2 21* 23  GLUCOSE 140* 129*  BUN 20 23*  CREATININE 1.14* 1.25*  CALCIUM 8.9 8.6*  AST 26  --   ALT 18  --   ALKPHOS 94  --   BILITOT 1.2  --     Cardiac Enzymes  Recent Labs Lab 02/03/16 1020  TROPONINI 0.06*   RADIOLOGY:  Dg Chest 2 View  02/02/2016  CLINICAL DATA:  Productive cough.  Recent fall. EXAM: CHEST  2 VIEW COMPARISON:  12/09/2012 FINDINGS: Heart size remains within normal limits. Increased central peribronchial thickening is noted as well mild bibasilar atelectasis versus infiltrates. No evidence of pleural effusion. IMPRESSION: Increased central peribronchial thickening, and mild bibasilar atelectasis versus infiltrates. Electronically Signed   By: Myles Rosenthal M.D.   On: 02/02/2016 18:58   Ct Head Wo Contrast  02/02/2016  CLINICAL DATA:  Altered mental status since a fall last night. EXAM: CT HEAD WITHOUT CONTRAST CT CERVICAL SPINE WITHOUT CONTRAST TECHNIQUE: Multidetector CT imaging of the head and cervical spine was performed following the standard protocol without intravenous contrast. Multiplanar CT image reconstructions of the cervical spine were also generated. COMPARISON:  None. FINDINGS: CT HEAD FINDINGS Diffusely enlarged ventricles and subarachnoid spaces. Patchy white matter low density in both cerebral hemispheres. 1.2 x 0.8 cm oval area of increased density and the left parietal region in an extra-axial location on images 19 and 20. This measures 58 Hounsfield units in density. No skull fractures or paranasal sinus air-fluid levels. Mild sphenoid sinus mucosal thickening. CT CERVICAL SPINE FINDINGS Multilevel degenerative changes. These include facet degenerative changes with associated mild anterolisthesis at the C4-5, C5-6 and C6-7 levels. No prevertebral soft tissue swelling or fractures.  IMPRESSION: 1. No skull fracture or intracranial hemorrhage. 2. No cervical spine fracture or traumatic subluxation. 3. Moderate to marked diffuse cerebral and cerebellar atrophy and chronic small vessel white matter ischemic changes. 4. 1.2 cm left parietal meningioma. 5. Cervical spine degenerative changes. 6. Minimal chronic  sphenoid sinusitis. Electronically Signed   By: Beckie SaltsSteven  Reid M.D.   On: 02/02/2016 18:39   Ct Cervical Spine Wo Contrast  02/02/2016  CLINICAL DATA:  Altered mental status since a fall last night. EXAM: CT HEAD WITHOUT CONTRAST CT CERVICAL SPINE WITHOUT CONTRAST TECHNIQUE: Multidetector CT imaging of the head and cervical spine was performed following the standard protocol without intravenous contrast. Multiplanar CT image reconstructions of the cervical spine were also generated. COMPARISON:  None. FINDINGS: CT HEAD FINDINGS Diffusely enlarged ventricles and subarachnoid spaces. Patchy white matter low density in both cerebral hemispheres. 1.2 x 0.8 cm oval area of increased density and the left parietal region in an extra-axial location on images 19 and 20. This measures 58 Hounsfield units in density. No skull fractures or paranasal sinus air-fluid levels. Mild sphenoid sinus mucosal thickening. CT CERVICAL SPINE FINDINGS Multilevel degenerative changes. These include facet degenerative changes with associated mild anterolisthesis at the C4-5, C5-6 and C6-7 levels. No prevertebral soft tissue swelling or fractures. IMPRESSION: 1. No skull fracture or intracranial hemorrhage. 2. No cervical spine fracture or traumatic subluxation. 3. Moderate to marked diffuse cerebral and cerebellar atrophy and chronic small vessel white matter ischemic changes. 4. 1.2 cm left parietal meningioma. 5. Cervical spine degenerative changes. 6. Minimal chronic sphenoid sinusitis. Electronically Signed   By: Beckie SaltsSteven  Reid M.D.   On: 02/02/2016 18:39   ASSESSMENT AND PLAN:  Rhonda Castaneda is a 82 y.o. female with a known history of mild dementia, history of TIAs not taking any medications currently brought in secondary to worsening confusion.  #1 altered mental status-metabolic encephalopathy secondary to UTI. -CT of the head showing diffuse cerebral and cerebellar atrophy with chronic small vessel ischemic changes. No acute  findings noted. Patient has dementia at baseline.  #2 UTI-urine cultures are sent. Since she was recently told that her urine cultures grew bacteria not sensitive to Keflex, start on Levaquin at this time. Follow repeat urine cultures  #3 acute renal failure-prerenal causes. IV fluids and monitor  #4 elevated troponin-could be demand ischemia. Patient denying any chest pain. Start aspirin. - Monitor on telemetry. -Cardiology consult appreciated. No cardiac w/u recommended  #5 DVT prophylaxis-on Lovenox  D/c back to her facility in am Case discussed with Care Management/Social Worker. Management plans discussed with the patient, family and they are in agreement.  CODE status full  TOTAL TIME TAKING CARE OF THIS PATIENT: 30 minutes.  >50% time spent on counselling and coordination of care  POSSIBLE D/C IN 1 DAYS, DEPENDING ON CLINICAL CONDITION.  Note: This dictation was prepared with Dragon dictation along with smaller phrase technology. Any transcriptional errors that result from this process are unintentional.  Brindle Leyba M.D on 02/03/2016 at 11:48 AM  Between 7am to 6pm - Pager - [phone]  After 6pm go to [email or web address] - password [email or web address] [facility]  [facility]  Office  [phone]  CC: Primary care physician; No primary care provider on file.

## 2016-02-03 NOTE — Plan of Care (Signed)
Problem: Education: Goal: Knowledge of [facility] Education information/materials will improve Outcome: Not Met (add Reason) Dementia, confusion  Problem: Health Behavior/Discharge Planning: Goal: Ability to manage health-related needs will improve Outcome: Not Met (add Reason) Dementia & confusion

## 2016-02-04 LAB — URINE CULTURE
Culture: >=100000 — AB
SPECIAL REQUESTS: NORMAL

## 2016-02-04 MED ORDER — CEPHALEXIN 500 MG PO CAPS: 500.0000 mg | ORAL_CAPSULE | Freq: Two times a day (BID) | ORAL | Status: DC

## 2016-02-04 MED ORDER — LEVOFLOXACIN 250 MG PO TABS: 250.0000 mg | ORAL_TABLET | Freq: Every day | ORAL | Status: DC

## 2016-02-04 MED ORDER — ASPIRIN 81 MG PO CHEW: 81.0000 mg | CHEWABLE_TABLET | Freq: Every day | ORAL | Status: AC

## 2016-02-04 NOTE — Discharge Summary (Addendum)
[facility] - [place] at [facility]   PATIENT NAME: Rhonda Castaneda    MR#:  [id number]  DATE OF BIRTH:  [birth year]  DATE OF ADMISSION:  02/02/2016 ADMITTING PHYSICIAN: Enid Baas, MD  DATE OF DISCHARGE: 02/03/16  PRIMARY CARE PHYSICIAN: No primary care provider on file.    ADMISSION DIAGNOSIS:  Elevated troponin I level [R79.89] Chronic dementia, without behavioral disturbance [F03.90]  DISCHARGE DIAGNOSIS:  AMS due to UTI Chronic Dementia Transient afib-resolved  SECONDARY DIAGNOSIS:   Past Medical History  Diagnosis Date  . Dementia   . TIA (transient ischemic attack)     HOSPITAL COURSE:  Rhonda Castaneda is a 82 y.o. female with a known history of mild dementia, history of TIAs not taking any medications currently brought in secondary to worsening confusion.  #1 altered mental status-metabolic encephalopathy secondary to UTI. -CT of the head showing diffuse cerebral and cerebellar atrophy with chronic small vessel ischemic changes. No acute findings noted. Patient has dementia at baseline.  #2 UTI-urine cultures are sent.  -UC positive for ecoli-pan sensitive. Change to po keflex  #3 acute renal failure-prerenal -recieved IV fluids   #4 elevated troponin-could be demand ischemia. Patient denying any chest pain. Start aspirin. -SR. Was in transient afib at admission, echo ok -Cardiology consult appreciated. No cardiac w/u recommended  #5 DVT prophylaxis-on Lovenox  D/c back to [facility] with HHPT  CONSULTS OBTAINED:  Treatment Team:  Marykay Lex, MD  DRUG ALLERGIES:  Not on File  DISCHARGE MEDICATIONS:   Current Discharge Medication List    START taking these medications   Details  aspirin 81 MG chewable tablet Chew 1 tablet (81 mg total) by mouth daily. Qty: 30 tablet, Refills: 0    cephALEXin (KEFLEX) 500 MG capsule Take 1 capsule (500 mg total) by mouth every 12 (twelve) hours. Qty: 12 capsule, Refills: 0         If you experience worsening of your admission symptoms, develop shortness of breath, life threatening emergency, suicidal or homicidal thoughts you must seek medical attention immediately by calling 911 or calling your MD immediately  if symptoms less severe.  You Must read complete instructions/literature along with all the possible adverse reactions/side effects for all the Medicines you take and that have been prescribed to you. Take any new Medicines after you have completely understood and accept all the possible adverse reactions/side effects.   Please note  You were cared for by a hospitalist during your hospital stay. If you have any questions about your discharge medications or the care you received while you were in the hospital after you are discharged, you can call the unit and asked to speak with the hospitalist on call if the hospitalist that took care of you is not available. Once you are discharged, your primary care physician will handle any further medical issues. Please note that NO REFILLS for any discharge medications will be authorized once you are discharged, as it is imperative that you return to your primary care physician (or establish a relationship with a primary care physician if you do not have one) for your aftercare needs so that they can reassess your need for medications and monitor your lab values. Today   SUBJECTIVE   No complaints. At baseline pleasantly confused  VITAL SIGNS:  Blood pressure 140/53, pulse 88, temperature 97.6 F (36.4 C), temperature source Oral, resp. rate 16, height 5\' 4"  (1.626 m), weight 67.189 kg (148 lb 2 oz), SpO2 95 %.  I/O:  Intake/Output Summary (Last 24 hours) at 02/04/16 1216 Last data filed at 02/04/16 1136  Gross per 24 hour  Intake      3 ml  Output   1150 ml  Net  -1147 ml    PHYSICAL EXAMINATION:  GENERAL:  82 y.o.-year-old patient lying in the bed with no acute distress.  EYES: Pupils equal, round, reactive  to light and accommodation. No scleral icterus. Extraocular muscles intact.  HEENT: Head atraumatic, normocephalic. Oropharynx and nasopharynx clear.  NECK:  Supple, no jugular venous distention. No thyroid enlargement, no tenderness.  LUNGS: Normal breath sounds bilaterally, no wheezing, rales,rhonchi or crepitation. No use of accessory muscles of respiration.  CARDIOVASCULAR: S1, S2 normal. No murmurs, rubs, or gallops.  ABDOMEN: Soft, non-tender, non-distended. Bowel sounds present. No organomegaly or mass.  EXTREMITIES: No pedal edema, cyanosis, or clubbing.  NEUROLOGIC:grossly intact , unable to do full assessment due to dementia PSYCHIATRIC:patient is alert  SKIN: No obvious rash, lesion, or ulcer.   DATA REVIEW:   CBC   Recent Labs Lab 02/03/16 0428  WBC 9.6  HGB 11.8*  HCT 34.0*  PLT 202    Chemistries   Recent Labs Lab 02/02/16 1725 02/03/16 0428  NA 131* 132*  K 3.7 4.1  CL 100* 101  CO2 21* 23  GLUCOSE 140* 129*  BUN 20 23*  CREATININE 1.14* 1.25*  CALCIUM 8.9 8.6*  AST 26  --   ALT 18  --   ALKPHOS 94  --   BILITOT 1.2  --     Microbiology Results   Recent Results (from the past 240 hour(s))  Urine culture     Status: Abnormal   Collection Time: 02/02/16  7:25 PM  Result Value Ref Range Status   Specimen Description URINE, RANDOM  Final   Special Requests Normal  Final   Culture >=100,000 COLONIES/mL ESCHERICHIA COLI (A)  Final   Report Status 02/04/2016 FINAL  Final   Organism ID, Bacteria ESCHERICHIA COLI (A)  Final      Susceptibility   Escherichia coli - MIC*    AMPICILLIN <=2 SENSITIVE Sensitive     CEFAZOLIN <=4 SENSITIVE Sensitive     CEFTRIAXONE <=1 SENSITIVE Sensitive     CIPROFLOXACIN <=0.25 SENSITIVE Sensitive     GENTAMICIN <=1 SENSITIVE Sensitive     IMIPENEM <=0.25 SENSITIVE Sensitive     NITROFURANTOIN <=16 SENSITIVE Sensitive     TRIMETH/SULFA <=20 SENSITIVE Sensitive     AMPICILLIN/SULBACTAM <=2 SENSITIVE Sensitive      PIP/TAZO <=4 SENSITIVE Sensitive     Extended ESBL NEGATIVE Sensitive     * >=100,000 COLONIES/mL ESCHERICHIA COLI  MRSA PCR Screening     Status: None   Collection Time: 02/02/16 11:00 PM  Result Value Ref Range Status   MRSA by PCR NEGATIVE NEGATIVE Final    Comment:        The GeneXpert MRSA Assay (FDA approved for NASAL specimens only), is one component of a comprehensive MRSA colonization surveillance program. It is not intended to diagnose MRSA infection nor to guide or monitor treatment for MRSA infections.     RADIOLOGY:  Dg Chest 2 View  02/02/2016  CLINICAL DATA:  Productive cough.  Recent fall. EXAM: CHEST  2 VIEW COMPARISON:  12/09/2012 FINDINGS: Heart size remains within normal limits. Increased central peribronchial thickening is noted as well mild bibasilar atelectasis versus infiltrates. No evidence of pleural effusion. IMPRESSION: Increased central peribronchial thickening, and mild bibasilar atelectasis versus infiltrates. Electronically Signed  By: Myles RosenthalJohn  Stahl M.D.   On: 02/02/2016 18:58   Ct Head Wo Contrast  02/02/2016  CLINICAL DATA:  Altered mental status since a fall last night. EXAM: CT HEAD WITHOUT CONTRAST CT CERVICAL SPINE WITHOUT CONTRAST TECHNIQUE: Multidetector CT imaging of the head and cervical spine was performed following the standard protocol without intravenous contrast. Multiplanar CT image reconstructions of the cervical spine were also generated. COMPARISON:  None. FINDINGS: CT HEAD FINDINGS Diffusely enlarged ventricles and subarachnoid spaces. Patchy white matter low density in both cerebral hemispheres. 1.2 x 0.8 cm oval area of increased density and the left parietal region in an extra-axial location on images 19 and 20. This measures 58 Hounsfield units in density. No skull fractures or paranasal sinus air-fluid levels. Mild sphenoid sinus mucosal thickening. CT CERVICAL SPINE FINDINGS Multilevel degenerative changes. These include facet  degenerative changes with associated mild anterolisthesis at the C4-5, C5-6 and C6-7 levels. No prevertebral soft tissue swelling or fractures. IMPRESSION: 1. No skull fracture or intracranial hemorrhage. 2. No cervical spine fracture or traumatic subluxation. 3. Moderate to marked diffuse cerebral and cerebellar atrophy and chronic small vessel white matter ischemic changes. 4. 1.2 cm left parietal meningioma. 5. Cervical spine degenerative changes. 6. Minimal chronic sphenoid sinusitis. Electronically Signed   By: Beckie SaltsSteven  Reid M.D.   On: 02/02/2016 18:39   Ct Cervical Spine Wo Contrast  02/02/2016  CLINICAL DATA:  Altered mental status since a fall last night. EXAM: CT HEAD WITHOUT CONTRAST CT CERVICAL SPINE WITHOUT CONTRAST TECHNIQUE: Multidetector CT imaging of the head and cervical spine was performed following the standard protocol without intravenous contrast. Multiplanar CT image reconstructions of the cervical spine were also generated. COMPARISON:  None. FINDINGS: CT HEAD FINDINGS Diffusely enlarged ventricles and subarachnoid spaces. Patchy white matter low density in both cerebral hemispheres. 1.2 x 0.8 cm oval area of increased density and the left parietal region in an extra-axial location on images 19 and 20. This measures 58 Hounsfield units in density. No skull fractures or paranasal sinus air-fluid levels. Mild sphenoid sinus mucosal thickening. CT CERVICAL SPINE FINDINGS Multilevel degenerative changes. These include facet degenerative changes with associated mild anterolisthesis at the C4-5, C5-6 and C6-7 levels. No prevertebral soft tissue swelling or fractures. IMPRESSION: 1. No skull fracture or intracranial hemorrhage. 2. No cervical spine fracture or traumatic subluxation. 3. Moderate to marked diffuse cerebral and cerebellar atrophy and chronic small vessel white matter ischemic changes. 4. 1.2 cm left parietal meningioma. 5. Cervical spine degenerative changes. 6. Minimal chronic  sphenoid sinusitis. Electronically Signed   By: Beckie SaltsSteven  Reid M.D.   On: 02/02/2016 18:39     Management plans discussed with the patient, family and they are in agreement.  CODE STATUS:     Code Status Orders        Start     Ordered   02/02/16 2221  Full code   Continuous     02/02/16 2220    Code Status History    Date Active Date Inactive Code Status Order ID Comments User Context   This patient has a current code status but no historical code status.      TOTAL TIME TAKING CARE OF THIS PATIENT: 40  minutes.    Jeromiah Ohalloran M.D on 02/04/2016 at 12:16 PM  Between 7am to 6pm - Pager - [phone] After 6pm go to [email or web address] - password [email or web address] [facility]  [facility]  Office  [phone]  CC: Primary care physician; No primary care provider on file.

## 2016-02-04 NOTE — Progress Notes (Signed)
Discharge instructions given to son and patient. IV and tele removed. Packet prepared by social work given to son and instructed to give to [facility]. Prescriptions in packet. AVS given to son and education on UTI. Patient is dressed and ready. Volunteer called.

## 2016-02-04 NOTE — Care Management Important Message (Signed)
Important Message  Patient Details  Name: Rhonda RohrerLillian S Castaneda MRN: [id number] Date of Birth: [birth year]   Medicare Important Message Given:  Yes    Olegario MessierKathy A Arshawn Valdez 02/04/2016, 10:27 AM

## 2016-02-04 NOTE — Care Management (Addendum)
Spoke with Rhonda Castaneda to update her on SN and PT order. She is agreeable. Explained to her that I would fax order to [facility] for follow up.   [facility] Updated her on SN and PT request. Faxed order to [phone]

## 2016-02-04 NOTE — [place] FL2 (Signed)
  [place] MEDICAID FL2 LEVEL OF CARE SCREENING TOOL     IDENTIFICATION  Patient Name: Rhonda Castaneda Birthdate: June 12, 1919 Sex: female Admission Date (Current Location): 02/02/2016  [facility] [place]   Facility and Address:  [facility], [address], [place], [place] [address]      Provider Number: [id number]  Attending Physician Name and Address:  Enedina FinnerSona Patel, MD  Relative Name and Phone Number:       Current Level of Care: Hospital Recommended Level of Care: Assisted Living Facility Prior Approval Number:    Date Approved/Denied:   PASRR Number:    Discharge Plan:  (ALF)    Current Diagnoses: Patient Active Problem List   Diagnosis Date Noted  . Sepsis (HCC) 02/02/2016    Orientation RESPIRATION BLADDER Height & Weight      (not oriented to self or otherwise)  Normal Continent Weight: 148 lb 2 oz (67.189 kg) Height:  5\' 4"  (162.6 cm)  BEHAVIORAL SYMPTOMS/MOOD NEUROLOGICAL BOWEL NUTRITION STATUS   (none)  (none) Continent Diet (heart healthy)  AMBULATORY STATUS COMMUNICATION OF NEEDS Skin   Limited Assist Verbally Normal                       Personal Care Assistance Level of Assistance  Dressing, Bathing, Feeding Bathing Assistance: Limited assistance Feeding assistance: Limited assistance Dressing Assistance: Limited assistance     Functional Limitations Info             SPECIAL CARE FACTORS FREQUENCY                       Contractures Contractures Info: Not present    Additional Factors Info  Code Status, Allergies Code Status Info: full Allergies Info: unknown           DISCHARGE MEDICATIONS:   Current Discharge Medication List    START taking these medications   Details  aspirin 81 MG chewable tablet Chew 1 tablet (81 mg total) by mouth daily. Qty: 30 tablet, Refills: 0    levofloxacin (LEVAQUIN) 250 MG tablet Take 1 tablet (250 mg total) by mouth  daily. Qty: 5 tablet, Refills: 0            Discharge Medications: Please see discharge summary for a list of discharge medications.  Relevant Imaging Results:  Relevant Lab Results:   Additional Information    Rhonda SpanielMonica Issabela Lesko, LCSW

## 2016-02-04 NOTE — Progress Notes (Signed)
Called [facility] to see if their Medical Record listed Allergies.  Per Cat Herbie [place] [facility] does not have allergies listed and they are making attempts to obtain info through patient's son. Per [facility] patient has no medical physician.  Bari MantisKristin Rubina Basinski PharmD Clinical Pharmacist 02/04/2016

## 2016-02-04 NOTE — Progress Notes (Signed)
Discharge has been prepared by social work and AVS printed and ready. Son contacted for transport. He is not available until 1330 to 1400 to come and get her. Will get patient dressed and ready around then.

## 2016-04-01 ENCOUNTER — Ambulatory Visit: Payer: Self-pay | Admitting: Urology

## 2016-04-14 ENCOUNTER — Encounter: Payer: Self-pay | Admitting: Urology

## 2016-04-14 ENCOUNTER — Ambulatory Visit (INDEPENDENT_AMBULATORY_CARE_PROVIDER_SITE_OTHER): Payer: Medicare HMO | Admitting: Urology

## 2016-04-14 ENCOUNTER — Other Ambulatory Visit: Payer: Self-pay

## 2016-04-14 VITALS — BP 137/68 | HR 82 | Ht 64.0 in

## 2016-04-14 DIAGNOSIS — N390 Urinary tract infection, site not specified: Secondary | ICD-10-CM

## 2016-04-14 LAB — BLADDER SCAN AMB NON-IMAGING: SCAN RESULT: 67

## 2016-04-14 NOTE — Progress Notes (Signed)
04/14/2016 2:28 PM   Rhonda Castaneda 1918-11-01 [id number]  Referring provider: No referring provider defined for this encounter.  Chief Complaint  Patient presents with  . Recurrent UTI    New Patient    HPI: The patient is a 82 year old female presents today for discussion recurrent urinary tract infections. She has had 2 in the last year. The patient has severe dementia and is unable to participate in the conversation. History was provided by her son and daughter-in-law. Per the son, she has had the diagnosis of urinary tract infections multiple times the last 4 months. This is made by Dr. making house calls. She is also been admitted to the hospital once the diagnosis of UTI. I only have 2 positive cultures at this time which grew Escherichia coli. None of these were performed with intermittent catheterization. She has no history of kidney stones. Her PVR today is 78.   PMH: Past Medical History  Diagnosis Date  . Dementia   . TIA (transient ischemic attack)     Surgical History: Past Surgical History  Procedure Laterality Date  . Surgery on rib      when young    Home Medications:    Medication List       This list is accurate as of: 04/14/16  2:28 PM.  Always use your most recent med list.               aspirin 81 MG chewable tablet  Chew 1 tablet (81 mg total) by mouth daily.     loratadine 10 MG tablet  Commonly known as:  CLARITIN  Take 10 mg by mouth daily.     nystatin cream  Commonly known as:  MYCOSTATIN  Apply 1 application topically 2 (two) times daily.     ondansetron 4 MG tablet  Commonly known as:  ZOFRAN  Take 4 mg by mouth every 8 (eight) hours as needed for nausea or vomiting.        Allergies: No Known Allergies  Family History: Family History  Problem Relation Age of Onset  . CAD    . Kidney cancer Neg Hx   . Bladder Cancer Neg Hx     Social History:  reports that she has quit smoking. She does not have any smokeless  tobacco history on file. She reports that she does not drink alcohol or use illicit drugs.  ROS: UROLOGY Frequent Urination?: Yes Hard to postpone urination?: Yes Burning/pain with urination?: No Get up at night to urinate?: Yes Leakage of urine?: Yes Urine stream starts and stops?: No Trouble starting stream?: No Do you have to strain to urinate?: No Blood in urine?: No Urinary tract infection?: Yes Sexually transmitted disease?: No Injury to kidneys or bladder?: No Painful intercourse?: No Weak stream?: No Currently pregnant?: No Vaginal bleeding?: No Last menstrual period?: n  Gastrointestinal Nausea?: Yes Vomiting?: No Indigestion/heartburn?: No Diarrhea?: Yes Constipation?: No  Constitutional Fever: No Night sweats?: No Weight loss?: No Fatigue?: Yes  Skin Skin rash/lesions?: No Itching?: No  Eyes Blurred vision?: No Double vision?: No  Ears/Nose/Throat Sore throat?: No Sinus problems?: No  Hematologic/Lymphatic Swollen glands?: No Easy bruising?: No  Cardiovascular Leg swelling?: Yes Chest pain?: No  Respiratory Cough?: No Shortness of breath?: No  Endocrine Excessive thirst?: No  Musculoskeletal Back pain?: No Joint pain?: No  Neurological Headaches?: No Dizziness?: No  Psychologic Depression?: No Anxiety?: No  Physical Exam: BP 137/68 mmHg  Pulse 82  Ht  (1.626 m)  Wt   Constitutional:  Alert and oriented, No acute distress. HEENT: [place] AT, moist mucus membranes.  Trachea midline, no masses. Cardiovascular: No clubbing, cyanosis, or edema. Respiratory: Normal respiratory effort, no increased work of breathing. GI: Abdomen is soft, nontender, nondistended, no abdominal masses GU: No CVA tenderness.  Skin: No rashes, bruises or suspicious lesions. Lymph: No cervical or inguinal adenopathy. Neurologic: Grossly intact, no focal deficits, moving all 4 extremities. Psychiatric: Normal mood and affect.  Laboratory  Data: Lab Results  Component Value Date   WBC 9.6 02/03/2016   HGB 11.8* 02/03/2016   HCT 34.0* 02/03/2016   MCV 86.5 02/03/2016   PLT 202 02/03/2016    Lab Results  Component Value Date   CREATININE 1.25* 02/03/2016    No results found for: PSA  No results found for: TESTOSTERONE  No results found for: HGBA1C  Urinalysis    Component Value Date/Time   COLORURINE YELLOW* 02/02/2016 1925   APPEARANCEUR TURBID* 02/02/2016 1925   LABSPEC 1.011 02/02/2016 1925   PHURINE 6.0 02/02/2016 1925   GLUCOSEU NEGATIVE 02/02/2016 1925   HGBUR 2+* 02/02/2016 1925   BILIRUBINUR NEGATIVE 02/02/2016 1925   KETONESUR NEGATIVE 02/02/2016 1925   PROTEINUR 100* 02/02/2016 1925   NITRITE NEGATIVE 02/02/2016 1925   LEUKOCYTESUR 3+* 02/02/2016 1925     Assessment & Plan:    1. Possible recurrent UTI Is unclear if the patient is actually developing recurrent urinary tract infections. Her urine samples were at high risk for being contaminated given that they were not performed with a catheterized specimen. Without a catheterized specimen, we cannot make a diagnosis of recurrent UTI. The patient will follow-up with a catheterized specimen if she develops symptoms of a urinary tract infection. If she does have multiple catheter specimen proven urinary tract infections, we can consider Estrace cream. I discussed this with the family. We will also need a renal ultrasound at that point to rule out nephrolithiasis. We did discuss the risks, benefits, indications of this Estrace cream. Again we will hold off on it until we have confirmed her to have recurrent urinary tract infections.  Return for if suspect UTI for catheterized specimen.  Hildred LaserBrian James San Lohmeyer, MD  [facility] [address], [address] [place], [place] [address] [phone]

## 2016-09-02 ENCOUNTER — Emergency Department: Payer: Medicare HMO

## 2016-09-02 ENCOUNTER — Emergency Department
Admission: EM | Admit: 2016-09-02 | Discharge: 2016-09-02 | Disposition: A | Discharge status: Home / Self Care | Payer: Medicare HMO | Attending: Emergency Medicine | Admitting: Emergency Medicine

## 2016-09-02 DIAGNOSIS — W1809XA Striking against other object with subsequent fall, initial encounter: Secondary | ICD-10-CM | POA: Insufficient documentation

## 2016-09-02 DIAGNOSIS — Y9389 Activity, other specified: Secondary | ICD-10-CM | POA: Diagnosis not present

## 2016-09-02 DIAGNOSIS — Z79899 Other long term (current) drug therapy: Secondary | ICD-10-CM | POA: Diagnosis not present

## 2016-09-02 DIAGNOSIS — S0990XA Unspecified injury of head, initial encounter: Secondary | ICD-10-CM | POA: Diagnosis present

## 2016-09-02 DIAGNOSIS — Y999 Unspecified external cause status: Secondary | ICD-10-CM | POA: Diagnosis not present

## 2016-09-02 DIAGNOSIS — F0390 Unspecified dementia without behavioral disturbance: Secondary | ICD-10-CM | POA: Insufficient documentation

## 2016-09-02 DIAGNOSIS — Z7982 Long term (current) use of aspirin: Secondary | ICD-10-CM | POA: Insufficient documentation

## 2016-09-02 DIAGNOSIS — Z87891 Personal history of nicotine dependence: Secondary | ICD-10-CM | POA: Insufficient documentation

## 2016-09-02 DIAGNOSIS — Y929 Unspecified place or not applicable: Secondary | ICD-10-CM | POA: Insufficient documentation

## 2016-09-02 DIAGNOSIS — W19XXXA Unspecified fall, initial encounter: Secondary | ICD-10-CM

## 2016-09-02 DIAGNOSIS — S0083XA Contusion of other part of head, initial encounter: Secondary | ICD-10-CM | POA: Diagnosis not present

## 2016-09-02 NOTE — ED Triage Notes (Signed)
[facility] [place] [facility]. Per ems pt rolled out of bed and was found on floor. Pt with hematoma noted to right forehead. perrl 3mm and brisk. Pt is alert to self only per norm per ems. No other injury noted.

## 2016-09-02 NOTE — ED Provider Notes (Signed)
[facility] Emergency Department Provider Note  ____________________________________________   First MD Initiated Contact with Patient 09/02/16 2211     (approximate)  I have reviewed the triage vital signs and the nursing notes.   HISTORY  Chief Complaint Fall and Head Injury   HPI Rhonda Castaneda is a 82 y.o. female with a history of dementia who was found tonight having likely rolled off the side of her bed and hitting the right side of her forehead on her nightstand. The patient is unable to recall the events of tonight due to her dementia. Per EMS, the patient yells out in pain whenever she is examined on most parts of her body at her baseline. However, when asked the patient is unable to localize any pain or have any specific complaints.   Past Medical History:  Diagnosis Date  . Dementia   . TIA (transient ischemic attack)     Patient Active Problem List   Diagnosis Date Noted  . Sepsis (HCC) 02/02/2016  . Dementia 10/23/2012    Past Surgical History:  Procedure Laterality Date  . surgery on rib     when young    Prior to Admission medications   Medication Sig Start Date End Date Taking? Authorizing Provider  aspirin 81 MG chewable tablet Chew 1 tablet (81 mg total) by mouth daily. 02/04/16   Enedina FinnerSona Patel, MD  loratadine (CLARITIN) 10 MG tablet Take 10 mg by mouth daily.    Historical Provider, MD  nystatin cream (MYCOSTATIN) Apply 1 application topically 2 (two) times daily.    Historical Provider, MD  ondansetron (ZOFRAN) 4 MG tablet Take 4 mg by mouth every 8 (eight) hours as needed for nausea or vomiting.    Historical Provider, MD    Allergies Actonel [risedronate sodium]; Atorvastatin; Benzalkonium; Biaxin [clarithromycin]; Nexium [esomeprazole]; Nicotine; Prilosec [omeprazole]; and Sulfa antibiotics  Family History  Problem Relation Age of Onset  . CAD    . Kidney cancer Neg Hx   . Bladder Cancer Neg Hx     Social  History Social History  Substance Use Topics  . Smoking status: Former Games developermoker  . Smokeless tobacco: Not on file     Comment: quit about 50 years ago  . Alcohol use No    Review of Systems Level V caveat secondary to patient's baseline dementia.  ____________________________________________   PHYSICAL EXAM:  VITAL SIGNS: ED Triage Vitals [09/02/16 2208]  Enc Vitals Group     BP (!) 164/75     Pulse Rate 71     Resp 16     Temp 97.5 F (36.4 C)     Temp Source Oral     SpO2 98 %     Weight 142 lb (64.4 kg)     Height      Head Circumference      Peak Flow      Pain Score      Pain Loc      Pain Edu?      Excl. in GC?     Constitutional: Alert and in no acute distress. Eyes: Conjunctivae are normal. PERRL. EOMI. Head: Ecchymosis of the right side of the forehead and over the right eyebrow. No bogginess. No depression. No crepitus. Nose: No congestion/rhinnorhea. Mouth/Throat: Mucous membranes are moist.   Neck: No stridor.  No tenderness in midline cervical spine. No deformity or step-off. Cardiovascular: Normal rate, regular rhythm. Grossly normal heart sounds. Respiratory: Normal respiratory effort.  No retractions. Lungs CTAB.  Gastrointestinal: Soft and nontender. No distention.  Musculoskeletal: No lower extremity tenderness nor edema.  No joint effusions. Neurologic:  No gross focal neurologic deficits are appreciated.  Skin:  Skin is warm, dry and intact. No rash noted. Psychiatric: Mood and affect are normal. Speech and behavior are normal.  Exam confounded by whenever I am touching the patient is yelling in pain. However, each area elicits and equal response and it is not evident that any area is worse than another so I assume that this is the patient's baseline status. ____________________________________________   LABS (all labs ordered are listed, but only abnormal results are displayed)  Labs Reviewed - No data to  display ____________________________________________  EKG   ____________________________________________  RADIOLOGY    DG Chest 1 View (Final result)  Result time 09/02/16 22:43:36  Final result by Janice Coffin, MD (09/02/16 22:43:36)           Narrative:   CLINICAL DATA: Patient fell out of bed. Hematoma of the right forehead. Dementia.  EXAM: CHEST 1 VIEW  COMPARISON: 02/02/2016 CXR  FINDINGS: The heart and mediastinal contours are stable with aortic atherosclerosis. Cardiac silhouette is within normal limits for size. Chronic age related interstitial prominence is noted with probable atelectasis and/or scarring at the left lung base. No pneumonic consolidation nor overt pulmonary edema. No effusion or pneumothorax. No acute nor suspicious osseous abnormality.  IMPRESSION: Left basilar atelectasis. No pneumonic consolidation or overt pulmonary edema. No acute osseous appearing abnormality.   Electronically Signed By: Tollie Eth M.D. On: 09/02/2016 22:43            DG Pelvis 1-2 Views (Final result)  Result time 09/02/16 22:44:13  Final result by Mitzi Hansen, MD (09/02/16 22:44:13)           Narrative:   CLINICAL DATA: 82 y/o F; found on floor. Hematoma to right forehead. History of dementia.  EXAM: PELVIS - 1-2 VIEW  COMPARISON: None.  FINDINGS: There is no evidence of pelvic fracture or diastasis. No pelvic bone lesions are seen.  IMPRESSION: Negative.   Electronically Signed By: Mitzi Hansen M.D. On: 09/02/2016 22:44            CT Head Wo Contrast (Final result)  Result time 09/02/16 22:44:45  Final result by Rubye Oaks, MD (09/02/16 22:44:45)           Narrative:   CLINICAL DATA: Fall out of bed. Right forehead hematoma.  EXAM: CT HEAD WITHOUT CONTRAST  TECHNIQUE: Contiguous axial images were obtained from the base of the skull through the vertex without intravenous  contrast.  COMPARISON: Head CT 02/02/2016  FINDINGS: Brain: Despite repeat exam, there is motion artifact. No evidence of acute intracranial abnormality allowing for motion. Stable atrophy and advanced chronic small vessel ischemia. The left parietal extra-axial hyperdense lesion on prior CT is obscured by motion, but grossly unchanged without surrounding mass effect. No evidence of hemorrhage. No subdural or extra-axial fluid collection. No midline shift.  Vascular: Atherosclerosis of skullbase vasculature without hyperdense vessel or abnormal calcification.  Skull: Negative for fracture or focal lesion.  Sinuses/Orbits: Small right frontal scalp hematoma. Chronic paranasal sinus disease with mucosal thickening of the sphenoid and right maxillary sinus.  Other: None.  IMPRESSION: No acute intracranial abnormality or fracture allowing for motion artifact.  Stable atrophy and advanced chronic small vessel ischemia. The small left parietal meningioma on prior CT is obscured by motion.   Electronically Signed By: Rubye Oaks M.D. On: 09/02/2016 22:44  ____________________________________________   PROCEDURES  Procedure(s) performed:   Procedures  Critical Care performed:   ____________________________________________   INITIAL IMPRESSION / ASSESSMENT AND PLAN / ED COURSE  Pertinent labs & imaging results that were available during my care of the patient were reviewed by me and considered in my medical decision making (see chart for details).  ----------------------------------------- 11:27 PM on 09/02/2016 -----------------------------------------  Patient with reassuring imaging. Will be discharged home. Attempted to call the patient's contact on the demographic section of her medical record but there was no answer. I did leave a message on the answering machine. Patient was sent in with just her medication list. No contact information on  the patient's paperwork sent from her nursing home.  Clinical Course      ____________________________________________   FINAL CLINICAL IMPRESSION(S) / ED DIAGNOSES  Final diagnoses:  Fall, initial encounter  Injury of head, initial encounter      NEW MEDICATIONS STARTED DURING THIS VISIT:  New Prescriptions   No medications on file     Note:  This document was prepared using Dragon voice recognition software and may include unintentional dictation errors.    Myrna Blazeravid Matthew Schaevitz, MD 09/02/16 [phone]

## 2016-09-02 NOTE — ED Notes (Signed)
Yellow non skid socks and high risk fall bracelet applied. siderails up x2, call bell in left hand, bed in low position, report to butch, rn.

## 2016-09-02 NOTE — ED Notes (Signed)
Pt assisted to toilet to void by Alissa, EDT at this time.

## 2017-09-18 ENCOUNTER — Emergency Department

## 2017-09-18 ENCOUNTER — Encounter: Payer: Self-pay | Admitting: *Deleted

## 2017-09-18 ENCOUNTER — Other Ambulatory Visit: Payer: Self-pay

## 2017-09-18 ENCOUNTER — Emergency Department
Admission: EM | Admit: 2017-09-18 | Discharge: 2017-09-19 | Disposition: A | Discharge status: Home / Self Care | Attending: Emergency Medicine | Admitting: Emergency Medicine

## 2017-09-18 DIAGNOSIS — Y33XXXA Other specified events, undetermined intent, initial encounter: Secondary | ICD-10-CM | POA: Insufficient documentation

## 2017-09-18 DIAGNOSIS — Z79899 Other long term (current) drug therapy: Secondary | ICD-10-CM | POA: Insufficient documentation

## 2017-09-18 DIAGNOSIS — Y929 Unspecified place or not applicable: Secondary | ICD-10-CM | POA: Diagnosis not present

## 2017-09-18 DIAGNOSIS — S9032XA Contusion of left foot, initial encounter: Secondary | ICD-10-CM | POA: Diagnosis not present

## 2017-09-18 DIAGNOSIS — Z87891 Personal history of nicotine dependence: Secondary | ICD-10-CM | POA: Insufficient documentation

## 2017-09-18 DIAGNOSIS — Y999 Unspecified external cause status: Secondary | ICD-10-CM | POA: Diagnosis not present

## 2017-09-18 DIAGNOSIS — F0390 Unspecified dementia without behavioral disturbance: Secondary | ICD-10-CM | POA: Insufficient documentation

## 2017-09-18 DIAGNOSIS — S90122A Contusion of left lesser toe(s) without damage to nail, initial encounter: Secondary | ICD-10-CM | POA: Diagnosis not present

## 2017-09-18 DIAGNOSIS — Z7982 Long term (current) use of aspirin: Secondary | ICD-10-CM | POA: Diagnosis not present

## 2017-09-18 DIAGNOSIS — Z8673 Personal history of transient ischemic attack (TIA), and cerebral infarction without residual deficits: Secondary | ICD-10-CM | POA: Insufficient documentation

## 2017-09-18 DIAGNOSIS — Y939 Activity, unspecified: Secondary | ICD-10-CM | POA: Diagnosis not present

## 2017-09-18 DIAGNOSIS — S99922A Unspecified injury of left foot, initial encounter: Secondary | ICD-10-CM | POA: Diagnosis present

## 2017-09-18 NOTE — ED Triage Notes (Signed)
Per EMS called out for black toes. Facility states pt is normally ambulatory and on memory care unit but for the last 2 days she hasnt wanted to get up and ambulate. 4th toe on Left foot is red and warm. 3rd and 5th toe also slightly reddened. VSS. Pt is cooperative and confused

## 2017-09-18 NOTE — ED Notes (Signed)
pts left 4th toe appears red and swollen. Similar to an injury from bumping or hitting her toe. Warm blankets given. Pt is very hard of hearing and had advanced dementia. She states she is warm now

## 2017-09-19 NOTE — ED Notes (Signed)
Pt to [facility]

## 2017-09-19 NOTE — Discharge Instructions (Signed)
There is no evidence of any vascular abnormality in Rhonda Castaneda's foot or toes, and the x-rays were normal.  It appears that she sustained a contusion (injury) to her fourth toe and less so to the surrounding toes and distal end of the foot, but again, fortunately her x-rays were normal.  We recommend elevation when possible, over-the-counter Tylenol and/or ibuprofen if needed for discomfort, and close follow up with her primary care provider.

## 2017-09-19 NOTE — ED Notes (Signed)
[place] at [facility] that patient is returning. Discharge update given. [facility]

## 2017-09-19 NOTE — ED Provider Notes (Signed)
[facility] Emergency Department Provider Note  ____________________________________________   First MD Initiated Contact with Patient 09/18/17 2346     (approximate)  I have reviewed the triage vital signs and the nursing notes.   HISTORY  Chief Complaint Toe Injury  Level 5 caveat:  history/ROS limited by chronic dementia  HPI Rogelia RohrerLillian S Trovato is a 83 y.o. female with chronic dementia but who is generally well-appearing and in good health for her age who comes from a nursing home by EMS for evaluation of discoloration of several toes on her left foot, primarily the fourth toe.  Minimal history is provided, but apparently for the last 2 days she is not been is willing to ambulate as usual.  The initial report was that her toes were "black", but as noted in the triage note, she has some redness and swelling in her fourth toe as well as what appears to be some bruising her foot and the surrounding to toes.  The patient is pleasantly confused, in no distress, initially watching a movie in the exam room and then sleeping comfortably when I checked on her.  There are no other complaints at this time.  Past Medical History:  Diagnosis Date  . Dementia   . TIA (transient ischemic attack)     Patient Active Problem List   Diagnosis Date Noted  . Sepsis (HCC) 02/02/2016  . Dementia 10/23/2012    Past Surgical History:  Procedure Laterality Date  . surgery on rib     when young    Prior to Admission medications   Medication Sig Start Date End Date Taking? Authorizing Provider  aspirin 81 MG chewable tablet Chew 1 tablet (81 mg total) by mouth daily. 02/04/16   Enedina FinnerPatel, Sona, MD  loratadine (CLARITIN) 10 MG tablet Take 10 mg by mouth daily.    [provider]  nystatin cream (MYCOSTATIN) Apply 1 application topically 2 (two) times daily.    [provider]  ondansetron (ZOFRAN) 4 MG tablet Take 4 mg by mouth every 8 (eight) hours as needed for  nausea or vomiting.    [provider]    Allergies Actonel [risedronate sodium]; Atorvastatin; Benzalkonium; Biaxin [clarithromycin]; Nexium [esomeprazole]; Nicotine; Prilosec [omeprazole]; and Sulfa antibiotics  Family History  Problem Relation Age of Onset  . CAD Unknown   . Kidney cancer Neg Hx   . Bladder Cancer Neg Hx     Social History Social History   Tobacco Use  . Smoking status: Former Games developermoker  . Smokeless tobacco: Never Used  . Tobacco comment: quit about 50 years ago  Substance Use Topics  . Alcohol use: No    Alcohol/week: 0.0 oz  . Drug use: No    Review of Systems Level 5 caveat:  history/ROS limited by chronic dementia ____________________________________________   PHYSICAL EXAM:  VITAL SIGNS: ED Triage Vitals  Enc Vitals Group     BP 09/18/17 2320 (!) 136/100     Pulse Rate 09/18/17 2320 86     Resp 09/18/17 2320 16     Temp 09/18/17 2320 (!) 97.4 F (36.3 C)     Temp Source 09/18/17 2320 Axillary     SpO2 09/18/17 2320 96 %     Weight 09/18/17 2321 59 kg (130 lb)     Height 09/18/17 2321 1.549 m (5\' 1" )     Head Circumference --      Peak Flow --      Pain Score --  Pain Loc --      Pain Edu? --      Excl. in GC? --     Constitutional: Alert, no acute distress, pleasantly demented Eyes: Conjunctivae are normal.  Head: Atraumatic. Cardiovascular: Normal rate, regular rhythm. Good peripheral circulation including distal pulses in feet Respiratory: Normal respiratory effort.  No retractions. Lungs CTAB. Gastrointestinal: Soft and nontender. No distention.  Musculoskeletal: No peripheral edema.  The patient has what appears to be some ecchymosis and swelling of her left fourth toe and the top of the foot as well as a similar presentation but less so third toe.  On physical exam at least it appears most consistent with a bruise or traumatic injury rather than a vascular issue.  The toes are warm to the touch but not pathologically  so as would be the case with cellulitis.  There is no break in the skin or indication of infection. Neurologic:  Normal speech and language. No gross focal neurologic deficits are appreciated.    ____________________________________________   LABS (all labs ordered are listed, but only abnormal results are displayed)  Labs Reviewed - No data to display ____________________________________________  EKG  None - EKG not ordered by ED physician ____________________________________________  RADIOLOGY   Dg Foot Complete Left  Result Date: 09/19/2017 CLINICAL DATA:  Acute onset of left fourth toe swelling and erythema. EXAM: LEFT FOOT - COMPLETE 3+ VIEW COMPARISON:  None. FINDINGS: There is no evidence of fracture or dislocation. Mild degenerative change is noted at the distal interphalangeal joints. No osseous erosions are seen. There is no evidence of talar subluxation; the subtalar joint is unremarkable in appearance. Minimal vascular calcifications are noted. Known soft tissue swelling about the fourth toe is not well characterized on radiograph. Soft tissue swelling is noted about the ankle. IMPRESSION: No evidence of fracture or dislocation.  No osseous erosions seen. Electronically Signed   By: Roanna RaiderJeffery  Chang M.D.   On: 09/19/2017 00:28    ____________________________________________   PROCEDURES  Critical Care performed: No   Procedure(s) performed:   Procedures   ____________________________________________   INITIAL IMPRESSION / ASSESSMENT AND PLAN / ED COURSE  As part of my medical decision making, I reviewed the following data within the electronic MEDICAL RECORD NUMBER Nursing notes reviewed and incorporated and Radiograph reviewed     Differential diagnosis includes, but is not limited to, bruise/traumatic injury, toe fracture/dislocation, vascular disruption including both arterial and venous occlusion, cellulitis, etc.  My suspicion is this is an accidental blunt  trauma that led to ecchymosis.  I will evaluate with a radiograph and reassess.  Clinical Course as of Sep 20 203  Tue Sep 19, 2017  0159 Evidence of fracture or dislocation.  I reexamined the patient again her toes and foot appear that they have been contused but there is no evidence of any vascular insufficiency.  She has some mild tenderness to palpation although she is currently resting comfortably.  I will discharged back to her facility and encourage close outpatient follow-up. No indication for futher workup at this time DG Foot Complete Left [CF]    Clinical Course User Index [CF] Loleta RoseForbach, Shavar Gorka, MD    ____________________________________________  FINAL CLINICAL IMPRESSION(S) / ED DIAGNOSES  Final diagnoses:  Contusion of lesser toe of left foot without damage to nail, initial encounter  Contusion of left foot, initial encounter     MEDICATIONS GIVEN DURING THIS VISIT:  Medications - No data to display   ED Discharge Orders  None       Note:  This document was prepared using Dragon voice recognition software and may include unintentional dictation errors.    Loleta Rose, MD 09/19/17 [phone]

## 2018-11-26 ENCOUNTER — Emergency Department

## 2018-11-26 ENCOUNTER — Other Ambulatory Visit: Payer: Self-pay

## 2018-11-26 ENCOUNTER — Emergency Department
Admission: EM | Admit: 2018-11-26 | Discharge: 2018-11-27 | Disposition: A | Discharge status: Skilled Nursing Facility | Attending: Emergency Medicine | Admitting: Emergency Medicine

## 2018-11-26 ENCOUNTER — Encounter: Payer: Self-pay | Admitting: Emergency Medicine

## 2018-11-26 DIAGNOSIS — Y939 Activity, unspecified: Secondary | ICD-10-CM | POA: Insufficient documentation

## 2018-11-26 DIAGNOSIS — Y92129 Unspecified place in nursing home as the place of occurrence of the external cause: Secondary | ICD-10-CM | POA: Diagnosis not present

## 2018-11-26 DIAGNOSIS — F0390 Unspecified dementia without behavioral disturbance: Secondary | ICD-10-CM | POA: Insufficient documentation

## 2018-11-26 DIAGNOSIS — S0181XA Laceration without foreign body of other part of head, initial encounter: Secondary | ICD-10-CM | POA: Insufficient documentation

## 2018-11-26 DIAGNOSIS — Z7982 Long term (current) use of aspirin: Secondary | ICD-10-CM | POA: Insufficient documentation

## 2018-11-26 DIAGNOSIS — Z79899 Other long term (current) drug therapy: Secondary | ICD-10-CM | POA: Insufficient documentation

## 2018-11-26 DIAGNOSIS — Z87891 Personal history of nicotine dependence: Secondary | ICD-10-CM | POA: Diagnosis not present

## 2018-11-26 DIAGNOSIS — W19XXXA Unspecified fall, initial encounter: Secondary | ICD-10-CM | POA: Insufficient documentation

## 2018-11-26 DIAGNOSIS — S0990XA Unspecified injury of head, initial encounter: Secondary | ICD-10-CM | POA: Diagnosis present

## 2018-11-26 DIAGNOSIS — Y999 Unspecified external cause status: Secondary | ICD-10-CM | POA: Insufficient documentation

## 2018-11-26 NOTE — ED Triage Notes (Addendum)
EMS pt to rm 26 from [facility] after pt found on floor during rounds. Pt probably fell out of bed. EMS reports BP 148/84 HR 84. Pt has blood over her face and in her hair. Hematoma noted to the center of her forehead.

## 2018-11-27 ENCOUNTER — Emergency Department

## 2018-11-27 NOTE — ED Notes (Signed)
[employer] spoke with [employer]. Made her aware patient would be returning to facility by EMS.

## 2018-11-27 NOTE — ED Notes (Signed)
Patient returned from CT

## 2018-11-27 NOTE — ED Provider Notes (Signed)
[facility] Emergency Department Provider Note  ____________________________________________   First MD Initiated Contact with Patient 11/26/18 2338     (approximate)  I have reviewed the triage vital signs and the nursing notes.   HISTORY  Chief Complaint Fall  Level 5 caveat:  history/ROS limited by chronic dementia  HPI Rhonda Castaneda is a 85 y.o. female with history of dementia and nonverbal status who presents for evaluation after a fall.  No details are available except that she was found down with some blood on her face and a contusion to her forehead.   She is not able to provide any history as she is nonverbal at baseline according to the paramedics.  She has no gross deformities of her extremities but there was a lot of blood on her face.  She is not in distress.  No additional details about her situation were available.    Past Medical History:  Diagnosis Date  . Dementia (HCC)   . TIA (transient ischemic attack)     Patient Active Problem List   Diagnosis Date Noted  . Sepsis (HCC) 02/02/2016  . Dementia (HCC) 10/23/2012    Past Surgical History:  Procedure Laterality Date  . surgery on rib     when young    Prior to Admission medications   Medication Sig Start Date End Date Taking? Authorizing Provider  aspirin 81 MG chewable tablet Chew 1 tablet (81 mg total) by mouth daily. 02/04/16   Enedina Finner, MD  loratadine (CLARITIN) 10 MG tablet Take 10 mg by mouth daily.    [provider]  nystatin cream (MYCOSTATIN) Apply 1 application topically 2 (two) times daily.    [provider]  ondansetron (ZOFRAN) 4 MG tablet Take 4 mg by mouth every 8 (eight) hours as needed for nausea or vomiting.    [provider]    Allergies Actonel [risedronate sodium]; Atorvastatin; Benzalkonium; Biaxin [clarithromycin]; Nexium [esomeprazole]; Nicotine; Prilosec [omeprazole]; and Sulfa antibiotics  Family History    Problem Relation Age of Onset  . CAD Unknown   . Kidney cancer Neg Hx   . Bladder Cancer Neg Hx     Social History Social History   Tobacco Use  . Smoking status: Former Games developer  . Smokeless tobacco: Never Used  . Tobacco comment: quit about 50 years ago  Substance Use Topics  . Alcohol use: No    Alcohol/week: 0.0 standard drinks  . Drug use: No    Review of Systems Level 5 caveat:  history/ROS limited by chronic dementia ____________________________________________   PHYSICAL EXAM:  VITAL SIGNS: ED Triage Vitals  Enc Vitals Group     BP 11/26/18 2323 (!) 150/97     Pulse Rate 11/26/18 2323 81     Resp 11/26/18 2323 (!) 27     Temp 11/26/18 2323 (!) 97 F (36.1 C)     Temp Source 11/26/18 2323 Rectal     SpO2 11/26/18 2323 98 %     Weight 11/26/18 2324 56.7 kg (125 lb)     Height 11/26/18 2324 1.575 m (5\' 2" )     Head Circumference --      Peak Flow --      Pain Score --      Pain Loc --      Pain Edu? --      Excl. in GC? --     Constitutional: Awakens to loud voice but is not responsive to verbal cues or questions.  Elderly, no acute distress but appears chronically ill. Eyes: Conjunctivae are normal. PERRL. EOMI. Head: Contusion with small laceration to the center of her forehead right on the hairline. Nose: No epistaxis. Mouth/Throat: Mucous membranes are moist.   Neck: No stridor.  No meningeal signs.  No cervical spine tenderness to palpation. Cardiovascular: Normal rate, regular rhythm. Good peripheral circulation. Grossly normal heart sounds. Respiratory: Normal respiratory effort.  No retractions. Lungs CTAB. Gastrointestinal: Soft and nontender. No distention.  Musculoskeletal: No gross deformities of her extremities.  I was able to passively range both of her arms and her legs.  She resists examination and seems to want to be left alone, but it does not seem to reproduce any pain or tenderness and there are no obvious signs of injury.  The patient  was changed out of her clothes and into a hospital gown for better evaluation. Neurologic:  Normal speech and language. No gross focal neurologic deficits are appreciated.  Skin:  Skin is warm, dry and intact except for the forehead contusion/laceration as described above.   ____________________________________________   LABS (all labs ordered are listed, but only abnormal results are displayed)  Labs Reviewed - No data to display ____________________________________________  EKG  ED ECG REPORT I, Loleta Rose, the attending physician, personally viewed and interpreted this ECG.  Date: 11/26/2018 EKG Time: 23:32 Rate: 81 Rhythm: normal sinus rhythm QRS Axis: normal Intervals: normal ST/T Wave abnormalities: Non-specific ST segment / T-wave changes, but no clear evidence of acute ischemia. Narrative Interpretation: no definitive evidence of acute ischemia; does not meet STEMI criteria.   ____________________________________________  RADIOLOGY   ED MD interpretation: No indication of acute injury on head and cervical spine CT  Official radiology report(s): Ct Head Wo Contrast  Result Date: 11/27/2018 CLINICAL DATA:  85 year old found on floor, likely unwitnessed fall out of bed. Forehead hematoma. EXAM: CT HEAD WITHOUT CONTRAST CT CERVICAL SPINE WITHOUT CONTRAST TECHNIQUE: Multidetector CT imaging of the head and cervical spine was performed following the standard protocol without intravenous contrast. Multiplanar CT image reconstructions of the cervical spine were also generated. COMPARISON:  Head CT 09/02/2016. Head and cervical spine CT 02/02/2016 FINDINGS: CT HEAD FINDINGS Brain: Stable but advanced atrophy. Moderate to advanced chronic small vessel ischemia. Unchanged left parietooccipital meningioma measuring 8 mm in depth. No associated edema or mass effect. No acute hemorrhage, ischemia, or subdural collection. Vascular: Atherosclerosis of skullbase vasculature without  hyperdense vessel or abnormal calcification. Skull: No fracture or focal lesion. Sinuses/Orbits: Chronic opacification of right maxillary sinus. No acute findings. Other: Midline frontal scalp hematoma. CT CERVICAL SPINE FINDINGS Alignment: No traumatic subluxation. Mild anterolisthesis of C4 on C5 is unchanged from prior exam. Skull base and vertebrae: No acute fracture. Vertebral body heights are maintained. The dens and skull base are intact. Soft tissues and spinal canal: No prevertebral fluid or swelling. No visible canal hematoma. Disc levels: Disc space narrowing and endplate spurring Z6-X0 through C6-C7. Multilevel facet hypertrophy. Degenerative changes are similar to prior exam. Upper chest: No acute findings. Other: None. IMPRESSION: 1. Midline frontal scalp hematoma without skull fracture or acute intracranial abnormality. 2. Stable atrophy and chronic small vessel ischemia. Unchanged small left parietooccipital meningioma. 3. Degenerative change in the cervical spine without acute fracture or subluxation. Electronically Signed   By: Narda Rutherford M.D.   On: 11/27/2018 00:47   Ct Cervical Spine Wo Contrast  Result Date: 11/27/2018 CLINICAL DATA:  85 year old found on floor, likely unwitnessed fall out of bed. Forehead hematoma. EXAM:  CT HEAD WITHOUT CONTRAST CT CERVICAL SPINE WITHOUT CONTRAST TECHNIQUE: Multidetector CT imaging of the head and cervical spine was performed following the standard protocol without intravenous contrast. Multiplanar CT image reconstructions of the cervical spine were also generated. COMPARISON:  Head CT 09/02/2016. Head and cervical spine CT 02/02/2016 FINDINGS: CT HEAD FINDINGS Brain: Stable but advanced atrophy. Moderate to advanced chronic small vessel ischemia. Unchanged left parietooccipital meningioma measuring 8 mm in depth. No associated edema or mass effect. No acute hemorrhage, ischemia, or subdural collection. Vascular: Atherosclerosis of skullbase  vasculature without hyperdense vessel or abnormal calcification. Skull: No fracture or focal lesion. Sinuses/Orbits: Chronic opacification of right maxillary sinus. No acute findings. Other: Midline frontal scalp hematoma. CT CERVICAL SPINE FINDINGS Alignment: No traumatic subluxation. Mild anterolisthesis of C4 on C5 is unchanged from prior exam. Skull base and vertebrae: No acute fracture. Vertebral body heights are maintained. The dens and skull base are intact. Soft tissues and spinal canal: No prevertebral fluid or swelling. No visible canal hematoma. Disc levels: Disc space narrowing and endplate spurring Q7-M2 through C6-C7. Multilevel facet hypertrophy. Degenerative changes are similar to prior exam. Upper chest: No acute findings. Other: None. IMPRESSION: 1. Midline frontal scalp hematoma without skull fracture or acute intracranial abnormality. 2. Stable atrophy and chronic small vessel ischemia. Unchanged small left parietooccipital meningioma. 3. Degenerative change in the cervical spine without acute fracture or subluxation. Electronically Signed   By: Narda Rutherford M.D.   On: 11/27/2018 00:47    ____________________________________________   PROCEDURES   Procedure(s) performed (including Critical Care):  Procedures   ____________________________________________   INITIAL IMPRESSION / MDM / ASSESSMENT AND PLAN / ED COURSE  As part of my medical decision making, I reviewed the following data within the electronic MEDICAL RECORD NUMBER History obtained from family, Nursing notes reviewed and incorporated, Old EKG reviewed and Notes from prior ED visits      Differential diagnosis includes, but is not limited to, contusion, intracranial hemorrhage, cervical spine fracture, acute infection such as urinary tract infection or pneumonia.  The patient's CT scans of head and cervical spine are normal and have no evidence of acute injury.  She has been hemodynamically stable.  Her temperature  was a little bit low at 97 degrees rectal and she was given 3 blankets and she is resting comfortably.  It appears very likely that she rolled out of bed and struck her forehead and she seems to be at her baseline according to the report given to [place] by the paramedics.  I will try getting in touch with her family to see if they want additional medical work-up at this time but it does not seem to be indicated based on her current presentation.   Clinical Course as of Nov 27 399  Tue Nov 27, 2018  0309 Tried calling patient's emergency contact but no one answered and I could not leave a voicemail.   [CF]  0321 Patient has been stable in the ED for hours.  Will discharge back to facility.   [CF]  0345 I was able to get a hold of the patient's emergency contact, Roylene Reason.  We discussed the patient in detail.  I explained to the best of my ability what had happened and her evaluation and work-up tonight, including the fact that her temperature is a little bit low at 97 degrees rectal but that she has been stable for the last 4 and half hours. Ms. Duncil is comfortable with the plan to discharge back  to the facility without additional work-up.  The patient does have a history of urinary tract infections previously, but she has recently been treated, and as I pointed out, if the patient had not rolled out of bed and sustained a minor head injury, we would not be doing any further investigation or evaluation.  The patient's family understands that I cannot definitively rule out infection but the patient has been stable and she will follow-up with her later today at [facility] house.  I will continue to work on discharge.   [CF]    Clinical Course User Index [CF] Loleta Rose, MD    ____________________________________________  FINAL CLINICAL IMPRESSION(S) / ED DIAGNOSES  Final diagnoses:  Fall, initial encounter  Laceration of forehead, initial encounter     MEDICATIONS GIVEN DURING THIS  VISIT:  Medications - No data to display   ED Discharge Orders    None       Note:  This document was prepared using Dragon voice recognition software and may include unintentional dictation errors.   Loleta Rose, MD 11/27/18 [phone]

## 2018-11-27 NOTE — Discharge Instructions (Addendum)
Rhonda Castaneda has a contusion her forehead with a minor injury, but fortunately the CT scans of her head and neck were negative. I spoke by phone with Ms. Caitlyne Klemz and we agreed to not pursue any additional work-up at this time.  Please have her follow-up with her regular doctor at the next available opportunity and return to the emergency department if she develops new or worsening symptoms.

## 2018-11-27 NOTE — ED Notes (Signed)
Patient transported to CT 

## 2018-11-27 NOTE — ED Notes (Signed)
Patient readjusted in bed. Will continue to monitor.

## 2018-11-27 NOTE — ED Notes (Signed)
EDP in with patient 

## 2019-09-27 DEATH — deceased

## 2020-07-05 IMAGING — CT CT CERVICAL SPINE W/O CM
3 of 5 series · 11 of 33 positions shown, 12 images · non-contrast
Comparison: Head CT 09/02/2016. Head and cervical spine CT
02/02/2016

CLINICAL DATA: [AGE] found on floor, likely unwitnessed fall
out of bed. Forehead hematoma.

EXAM:
CT HEAD WITHOUT CONTRAST
CT CERVICAL SPINE WITHOUT CONTRAST
TECHNIQUE: Multidetector CT imaging of the head and cervical spine was
performed following the standard protocol without intravenous
contrast. Multiplanar CT image reconstructions of the cervical spine
were also generated.

[Series 5: c spine soft · axial · 0.43mm/px · z∈[-243,-149]mm · 4 of 63 slices shown, 5 images]
[im 8/63  soft-tissue]
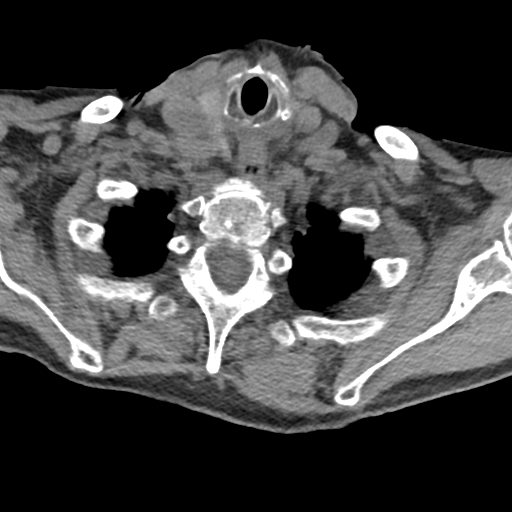
[im 8/63  bone]
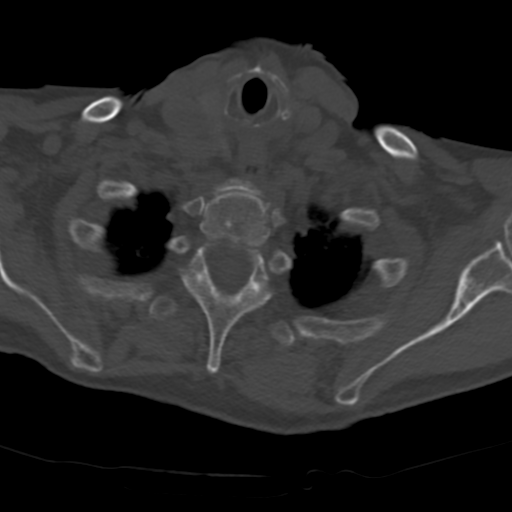
[im 24/63  bone]
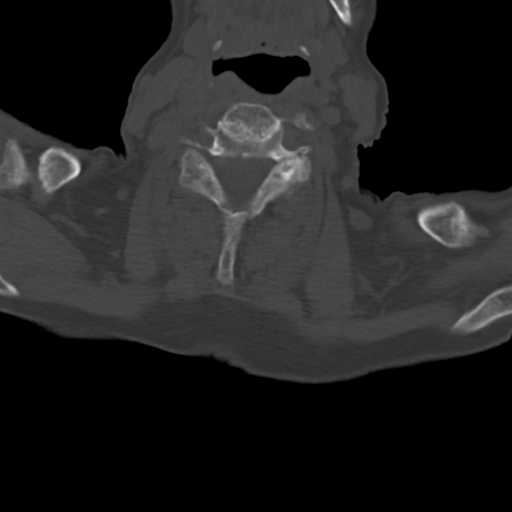
[im 39/63  bone]
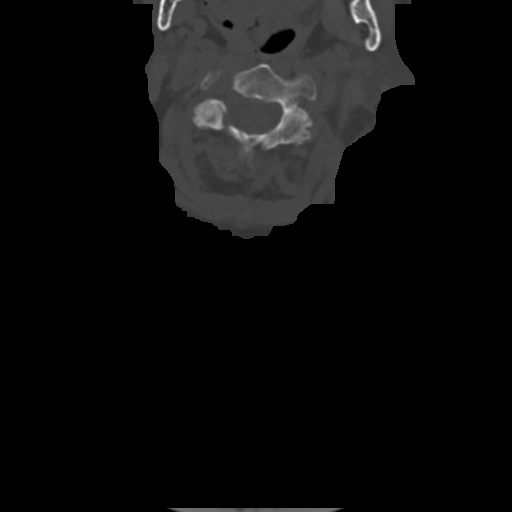
[im 55/63  bone]
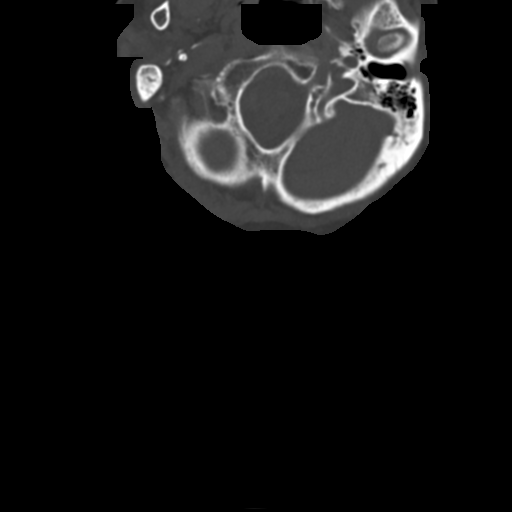

[Series 6: sagittal bone · sagittal · 0.17mm/px · 4 of 42 slices shown]
[im 9/42  bone]
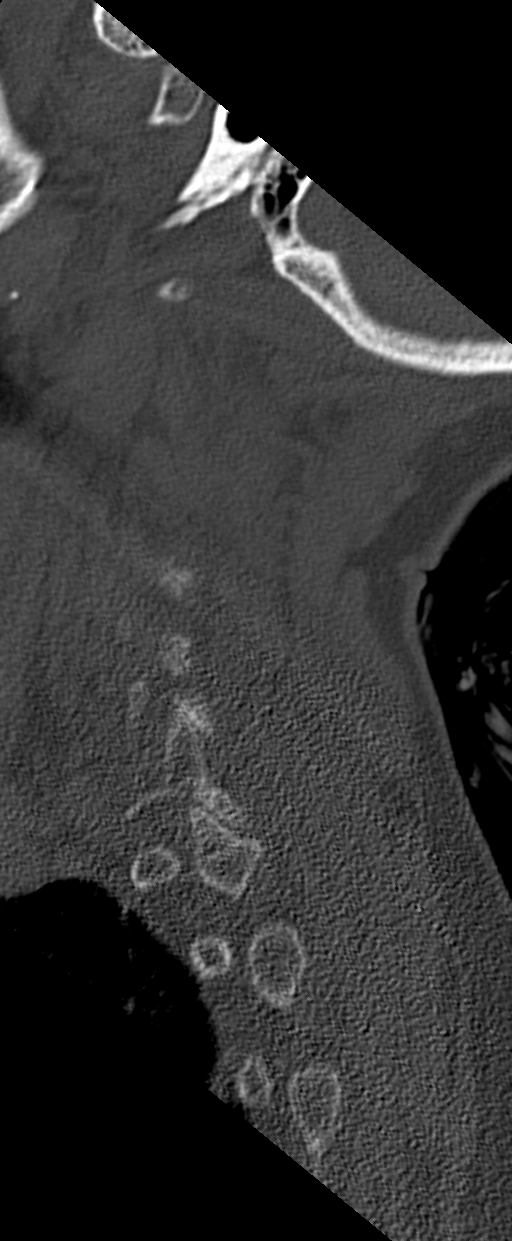
[im 17/42  bone]
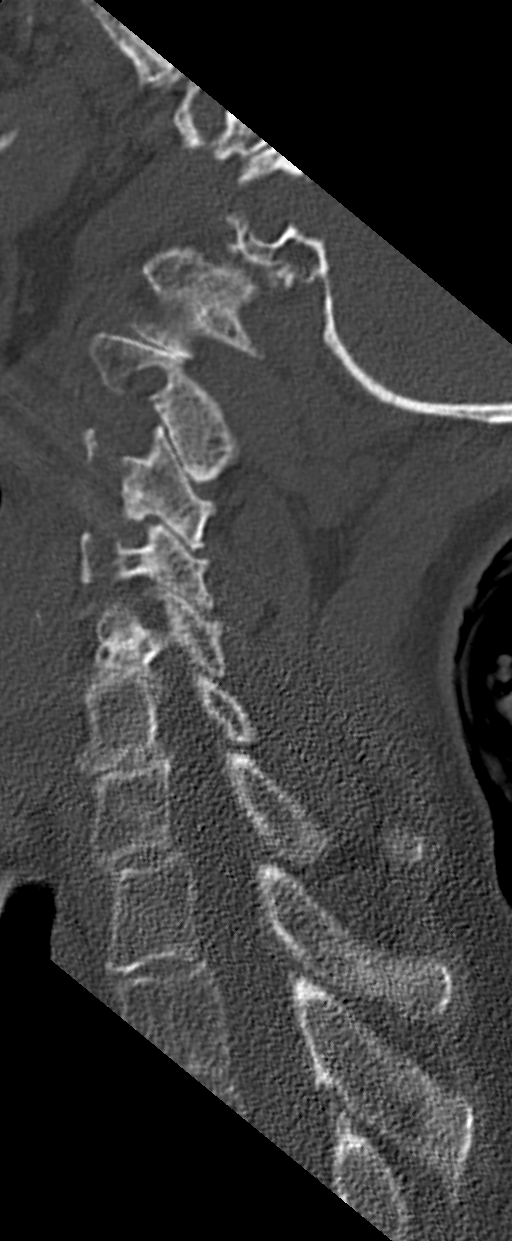
[im 25/42  bone]
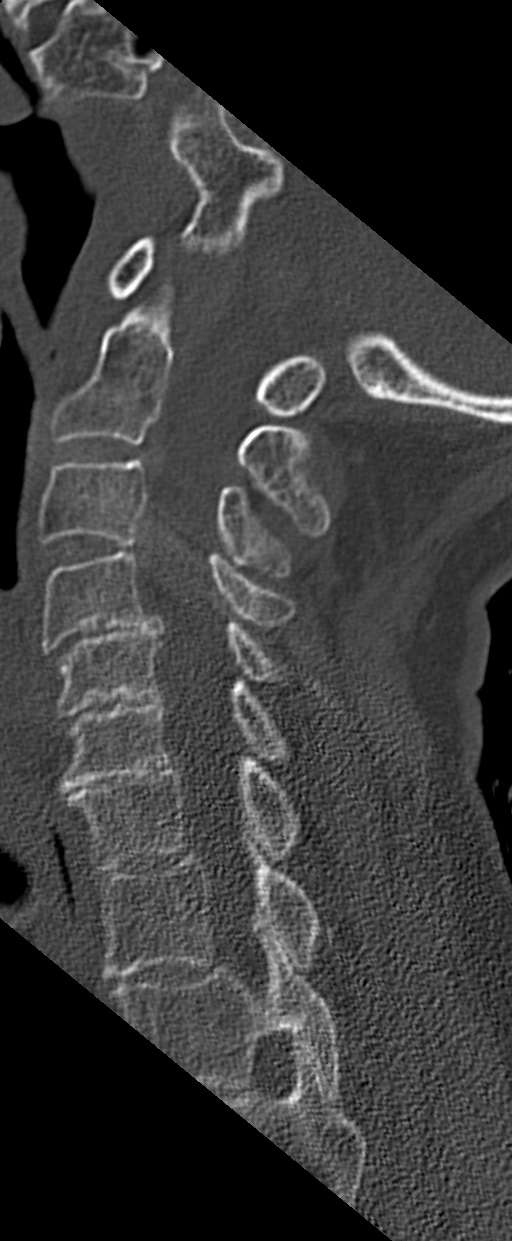
[im 33/42  bone]
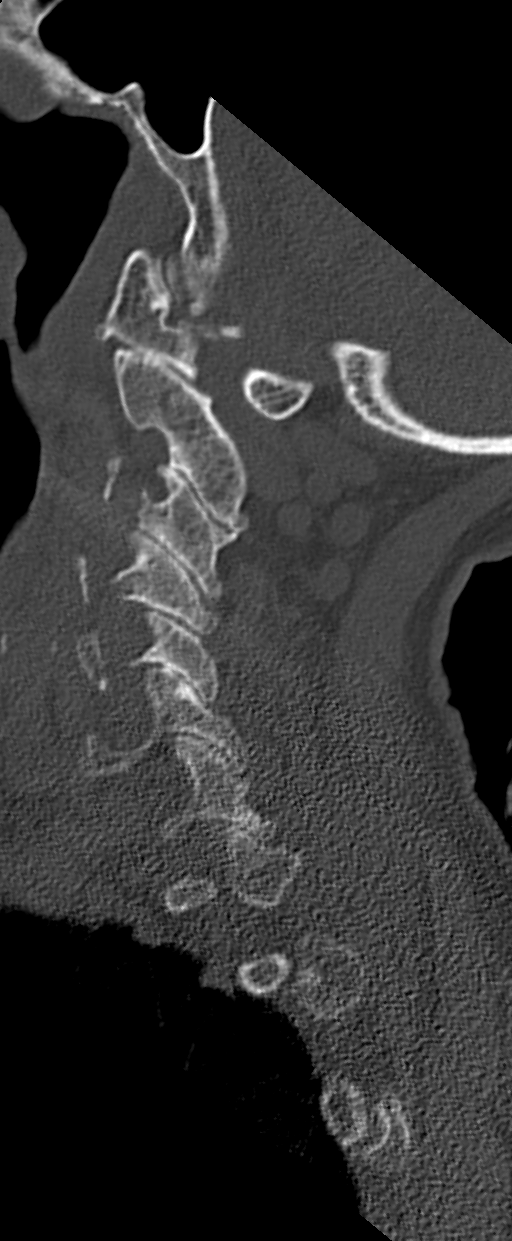

[Series 7: coronal soft tissue · coronal · 0.31mm/px · 3 of 64 slices shown]
[im 13/64  bone]
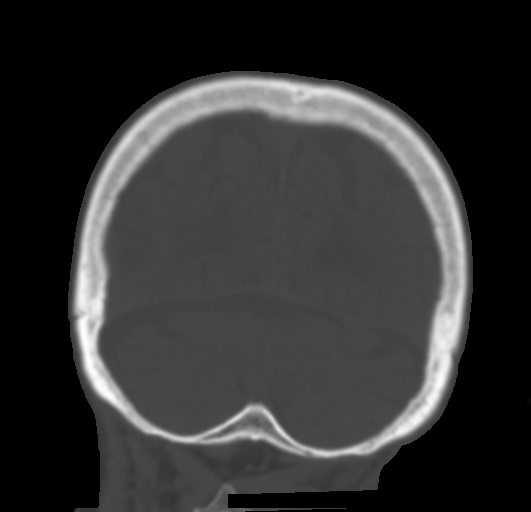
[im 26/64  bone]
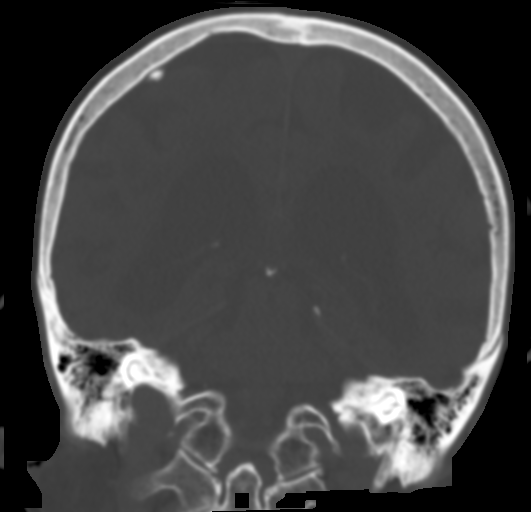
[im 38/64  bone]
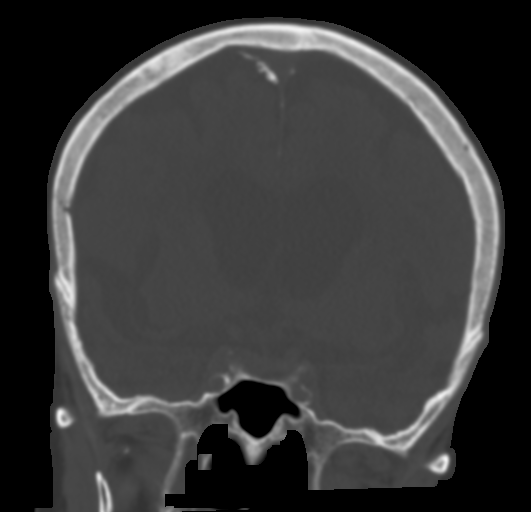

[11 of 33 positions shown; findings below may reference images not displayed]

FINDINGS: CT HEAD FINDINGS

Brain: Stable but advanced atrophy. Moderate to advanced chronic
small vessel ischemia. Unchanged left parietooccipital meningioma
measuring 8 mm in depth. No associated edema or mass effect. No
acute hemorrhage, ischemia, or subdural collection.

Vascular: Atherosclerosis of skullbase vasculature without
hyperdense vessel or abnormal calcification.

Skull: No fracture or focal lesion.

Sinuses/Orbits: Chronic opacification of right maxillary sinus. No
acute findings.

Other: Midline frontal scalp hematoma.

CT CERVICAL SPINE FINDINGS

Alignment: No traumatic subluxation. Mild anterolisthesis of C4 on
C5 is unchanged from prior exam.

Skull base and vertebrae: No acute fracture. Vertebral body heights
are maintained. The dens and skull base are intact.

Soft tissues and spinal canal: No prevertebral fluid or swelling. No
visible canal hematoma.

Disc levels: Disc space narrowing and endplate spurring C4-C5
through C6-C7. Multilevel facet hypertrophy. Degenerative changes
are similar to prior exam.

Upper chest: No acute findings.

Other: None.
IMPRESSION: 1. Midline frontal scalp hematoma without skull fracture or acute
intracranial abnormality.
2. Stable atrophy and chronic small vessel ischemia. Unchanged small
left parietooccipital meningioma.
3. Degenerative change in the cervical spine without acute fracture
or subluxation.

## 2020-10-15 ENCOUNTER — Telehealth: Payer: Self-pay | Admitting: *Deleted

## 2020-11-05 NOTE — Telephone Encounter (Signed)
Open error
